# Patient Record
Sex: Female | Born: 1975 | Hispanic: No | Marital: Married | Smoking: Never smoker
Health system: Southern US, Community
[De-identification: ages and names within clinical notes are randomized; demographics above are authoritative.]

## PROBLEM LIST (undated history)

## (undated) DIAGNOSIS — E079 Disorder of thyroid, unspecified: Secondary | ICD-10-CM

## (undated) HISTORY — PX: LAPAROSCOPIC GASTRIC SLEEVE RESECTION: SHX5895

## (undated) HISTORY — PX: THYROIDECTOMY: SHX17

## (undated) HISTORY — PX: TONSILLECTOMY: SUR1361

---

## 1998-12-30 ENCOUNTER — Other Ambulatory Visit: Admission: RE | Admit: 1998-12-30 | Discharge: 1998-12-30 | Payer: Self-pay | Admitting: *Deleted

## 1999-04-03 ENCOUNTER — Other Ambulatory Visit: Admission: RE | Admit: 1999-04-03 | Discharge: 1999-04-03 | Payer: Self-pay | Admitting: Obstetrics and Gynecology

## 1999-05-06 ENCOUNTER — Encounter (INDEPENDENT_AMBULATORY_CARE_PROVIDER_SITE_OTHER): Payer: Self-pay

## 1999-05-06 ENCOUNTER — Other Ambulatory Visit: Admission: RE | Admit: 1999-05-06 | Discharge: 1999-05-06 | Payer: Self-pay | Admitting: *Deleted

## 1999-06-10 ENCOUNTER — Encounter: Payer: Self-pay | Admitting: *Deleted

## 1999-06-10 ENCOUNTER — Inpatient Hospital Stay (HOSPITAL_COMMUNITY): Admission: AD | Admit: 1999-06-10 | Discharge: 1999-06-10 | Payer: Self-pay | Admitting: *Deleted

## 1999-07-14 ENCOUNTER — Inpatient Hospital Stay (HOSPITAL_COMMUNITY): Admission: AD | Admit: 1999-07-14 | Discharge: 1999-07-14 | Payer: Self-pay | Admitting: *Deleted

## 1999-07-15 ENCOUNTER — Inpatient Hospital Stay (HOSPITAL_COMMUNITY): Admission: AD | Admit: 1999-07-15 | Discharge: 1999-07-17 | Payer: Self-pay | Admitting: Obstetrics and Gynecology

## 1999-09-08 ENCOUNTER — Other Ambulatory Visit: Admission: RE | Admit: 1999-09-08 | Discharge: 1999-09-08 | Payer: Self-pay | Admitting: Obstetrics & Gynecology

## 2001-03-06 ENCOUNTER — Other Ambulatory Visit: Admission: RE | Admit: 2001-03-06 | Discharge: 2001-03-06 | Payer: Self-pay | Admitting: Obstetrics & Gynecology

## 2001-08-23 ENCOUNTER — Inpatient Hospital Stay (HOSPITAL_COMMUNITY): Admission: AD | Admit: 2001-08-23 | Discharge: 2001-08-23 | Payer: Self-pay | Admitting: Obstetrics and Gynecology

## 2001-09-03 ENCOUNTER — Encounter (INDEPENDENT_AMBULATORY_CARE_PROVIDER_SITE_OTHER): Payer: Self-pay | Admitting: Specialist

## 2001-09-03 ENCOUNTER — Inpatient Hospital Stay (HOSPITAL_COMMUNITY): Admission: AD | Admit: 2001-09-03 | Discharge: 2001-09-07 | Payer: Self-pay | Admitting: *Deleted

## 2001-09-12 ENCOUNTER — Inpatient Hospital Stay (HOSPITAL_COMMUNITY): Admission: AD | Admit: 2001-09-12 | Discharge: 2001-09-12 | Payer: Self-pay | Admitting: *Deleted

## 2002-10-11 ENCOUNTER — Other Ambulatory Visit: Admission: RE | Admit: 2002-10-11 | Discharge: 2002-10-11 | Payer: Self-pay | Admitting: Diagnostic Radiology

## 2002-10-23 ENCOUNTER — Encounter: Payer: Self-pay | Admitting: General Surgery

## 2002-10-24 ENCOUNTER — Ambulatory Visit (HOSPITAL_COMMUNITY): Admission: RE | Admit: 2002-10-24 | Discharge: 2002-10-24 | Payer: Self-pay | Admitting: General Surgery

## 2002-11-08 ENCOUNTER — Other Ambulatory Visit: Admission: RE | Admit: 2002-11-08 | Discharge: 2002-11-08 | Payer: Self-pay | Admitting: Obstetrics & Gynecology

## 2003-03-06 ENCOUNTER — Inpatient Hospital Stay (HOSPITAL_COMMUNITY): Admission: AD | Admit: 2003-03-06 | Discharge: 2003-03-06 | Payer: Self-pay | Admitting: Obstetrics and Gynecology

## 2003-04-26 ENCOUNTER — Inpatient Hospital Stay (HOSPITAL_COMMUNITY): Admission: AD | Admit: 2003-04-26 | Discharge: 2003-04-28 | Payer: Self-pay | Admitting: Obstetrics & Gynecology

## 2004-01-30 ENCOUNTER — Ambulatory Visit: Payer: Self-pay | Admitting: Internal Medicine

## 2004-02-07 ENCOUNTER — Ambulatory Visit: Payer: Self-pay | Admitting: Internal Medicine

## 2004-02-12 ENCOUNTER — Encounter (HOSPITAL_COMMUNITY): Admission: RE | Admit: 2004-02-12 | Discharge: 2004-05-12 | Payer: Self-pay | Admitting: Internal Medicine

## 2004-02-29 ENCOUNTER — Emergency Department (HOSPITAL_COMMUNITY): Admission: EM | Admit: 2004-02-29 | Discharge: 2004-02-29 | Payer: Self-pay | Admitting: Family Medicine

## 2004-03-25 ENCOUNTER — Ambulatory Visit (HOSPITAL_COMMUNITY): Admission: RE | Admit: 2004-03-25 | Discharge: 2004-03-25 | Payer: Self-pay | Admitting: Internal Medicine

## 2004-03-25 ENCOUNTER — Ambulatory Visit: Payer: Self-pay | Admitting: Internal Medicine

## 2004-03-26 ENCOUNTER — Observation Stay (HOSPITAL_COMMUNITY): Admission: RE | Admit: 2004-03-26 | Discharge: 2004-03-27 | Payer: Self-pay | Admitting: General Surgery

## 2004-03-26 ENCOUNTER — Encounter (INDEPENDENT_AMBULATORY_CARE_PROVIDER_SITE_OTHER): Payer: Self-pay | Admitting: *Deleted

## 2004-04-29 ENCOUNTER — Ambulatory Visit: Payer: Self-pay | Admitting: Internal Medicine

## 2006-04-09 IMAGING — CR DG CHEST 2V
2 series · 2 of 2 positions shown · non-contrast
Comparison: Chest x-ray 10/23/02 reviewed.

CLINICAL DATA: Preoperative respiratory films.
 [id number] VIEW:

[view not recorded (1 of 2)]
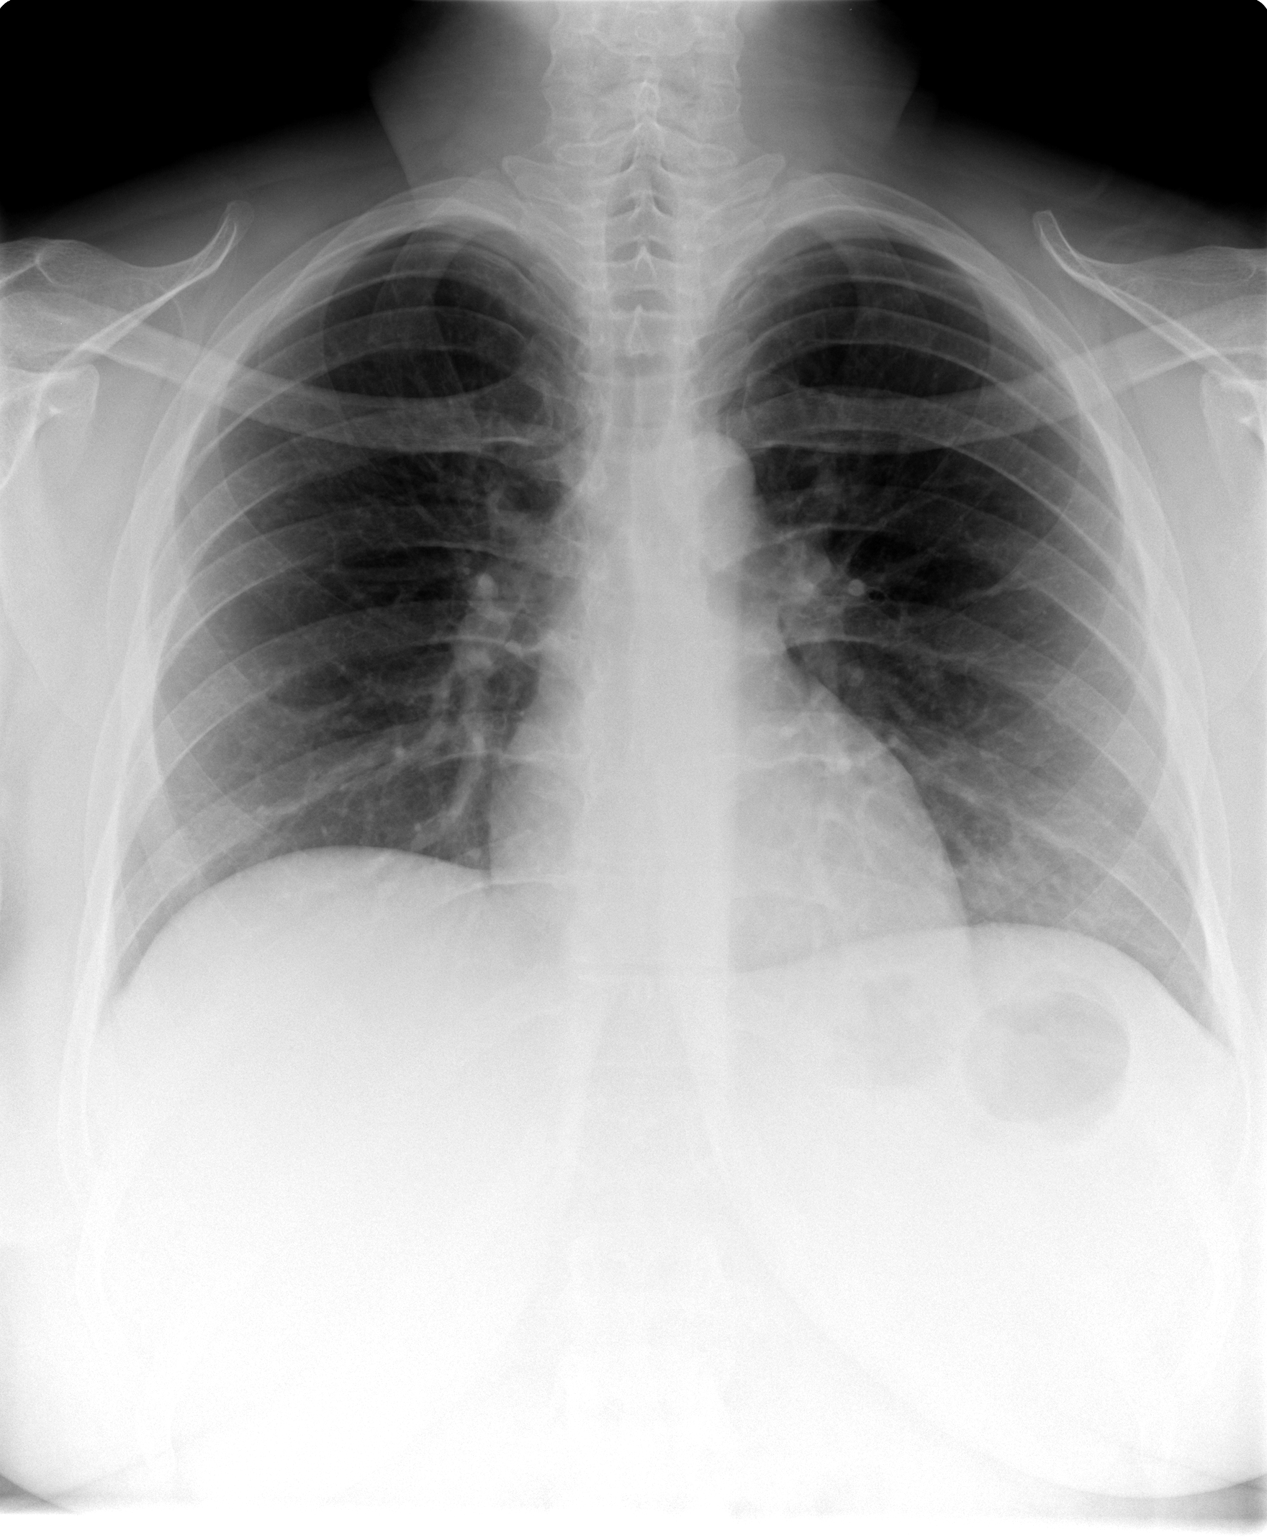

[view not recorded (2 of 2)]
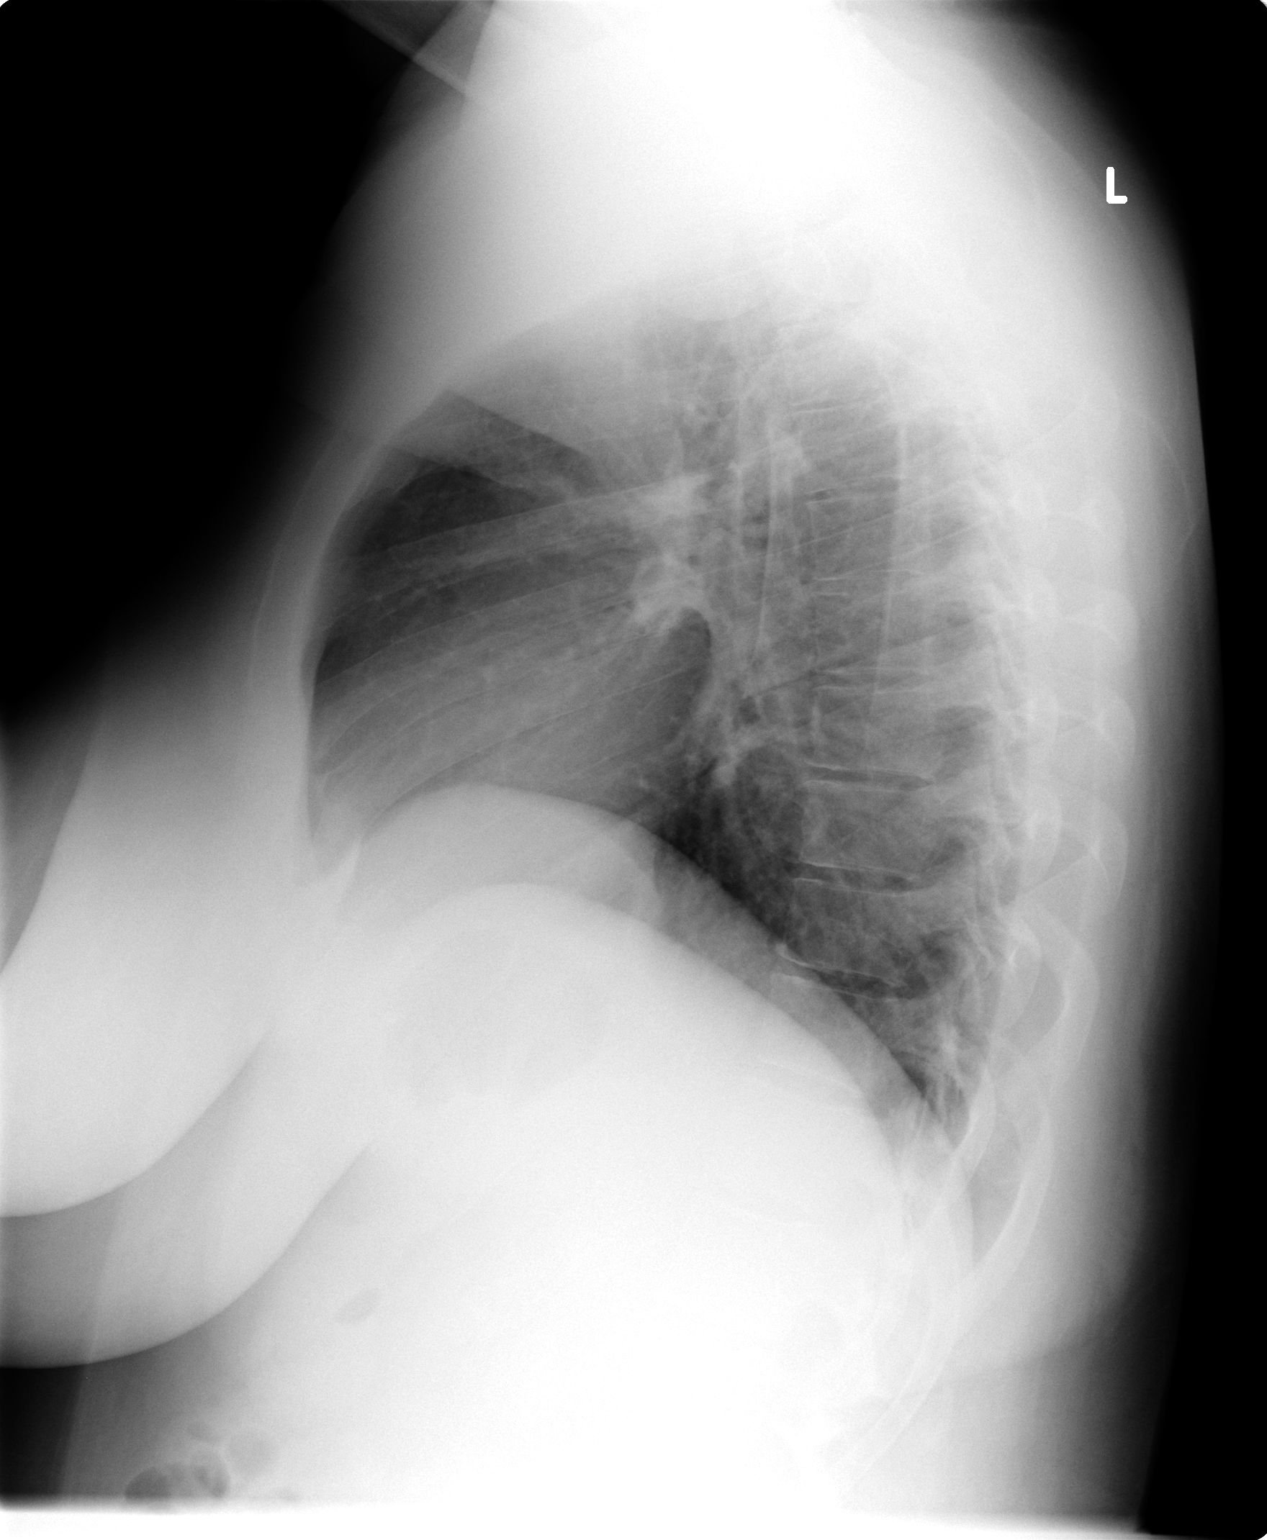

[2 of 2 positions shown; findings below may reference images not displayed]

FINDINGS: Lung volumes are low but the lungs are clear.  Costophrenic angles are sharp.  Heart size normal.  No focal bony abnormality.
IMPRESSION: Negative study.

## 2007-06-13 ENCOUNTER — Encounter: Admission: RE | Admit: 2007-06-13 | Discharge: 2007-06-13 | Payer: Self-pay | Admitting: Family Medicine

## 2008-02-06 ENCOUNTER — Encounter: Admission: RE | Admit: 2008-02-06 | Discharge: 2008-02-06 | Payer: Self-pay | Admitting: Family Medicine

## 2009-07-04 ENCOUNTER — Encounter: Admission: RE | Admit: 2009-07-04 | Discharge: 2009-07-04 | Payer: Self-pay | Admitting: Infectious Diseases

## 2010-01-27 ENCOUNTER — Other Ambulatory Visit: Payer: Self-pay | Admitting: Internal Medicine

## 2010-01-27 ENCOUNTER — Other Ambulatory Visit: Payer: Self-pay | Admitting: *Deleted

## 2010-01-27 DIAGNOSIS — N63 Unspecified lump in unspecified breast: Secondary | ICD-10-CM

## 2010-05-22 NOTE — Discharge Summary (Signed)
NAME:  Penny Walker, Penny Walker                         ACCOUNT NO.:  [id number]   MEDICAL RECORD NO.:  [id number]                   PATIENT TYPE:  INP   LOCATION:  9111                                 FACILITY:  [facility]   PHYSICIAN:  Phil D. Okey Dupre, M.D.                  DATE OF BIRTH:  [birth year]   DATE OF ADMISSION:  09/03/2001  DATE OF DISCHARGE:  09/07/2001                                 DISCHARGE SUMMARY   ADMITTING DIAGNOSIS:  Active labor.   DISCHARGE DIAGNOSIS:  Cesarean delivery of a single liveborn.   BRIEF ADMISSION HISTORY AND PHYSICAL:  The patient is a 35 year old G2, P1-0-  0-1 who presented at 40-1/7 weeks complaining of contractions.  She received  prenatal care at [facility] by the [facility] with onset of  care at 13 weeks.  Her previous pregnancy was in 2001 and was a spontaneous  vaginal delivery at term.  On exam her cervix was found to be 4 cm dilated,  70% effaced with the fetus at -1 station and vertex.  Fetal heart tracings  were reassuring and she was contracting every 6-10 minutes.  Membranes were  intact.  She was admitted to labor and delivery in early active labor.   HOSPITAL COURSE:  The patient was managed expectantly.  After her cervix  remained unchanged for several hours she was started on low-dose Pitocin to  reinstitute her labor pattern.  Stadol was given intravenously for pain  control.  Fetal heart tracings showed some decelerations from a baseline  heart rate of 120 to 130 down to 80s to 90s for approximately 3 minutes.  A  fetal scalp electrode and IUPC were placed.  Amnioinfusion was started.  After the patient received an epidural her blood pressure fell and she  received ephedrine x3 with a resultant increase in blood pressure.  However,  a prolonged decel lasting approximately 8 minutes with a fetal heart rate  less than 80 occurred.  After this decel the heart rate returned to  baseline.  Vaginal exam showed the cervix to be 7 cm  dilated, 100% effaced  and a 0 station.  IV fluid bolus was given.  However, the fetal heart rate  continued to experience deep variables eventually with slow return to  baseline and decreased variability.  The patient was then taken to C-section  for failure to progress and fetal intolerance of labor.  A low transverse  cesarean section was performed and a viable female infant was delivered using  a forceps blade.  It was found that the baby had a nuchal cord x1.  The baby  weighed 7 pounds 5 ounces and had Apgars of 7 at one minute, 9 at five  minutes and cord a pH of 7.3.  Both mother and baby tolerated the procedure  well.  A JP drain was placed during the procedure and removed on postop day  #  2.  The patient recovered appropriately and was discharged home on postop  day #3.  At discharge she expressed interest in having an IUD placed for  birth control at 6 weeks however, she did not desire any birth control upon  discharge.   DISCHARGE CONDITION:  Stable, good.   DISPOSITION:  Discharged to home with infant.   DISCHARGE MEDICATIONS:  1. Percocet 5/325 mg one pill q.4h. p.r.n. pain.  2. Motrin 600 mg one p.o. q.6h. p.r.n. cramps.  3. Colace 100 mg one p.o. q.a.m. and q.h.s. p.r.n. constipation.  4. Prenatal vitamins one p.o. q.d.   DISCHARGE INSTRUCTIONS:  1. Wound care per booklet.  2. Diet: Regular.  3. Activity: No heavy lifting (greater than 10 pounds) for 4 weeks and no     driving for 4 weeks.  Nothing per vagina for 6 weeks.  4. Followup: Return to maternity admissions at [facility] on Tuesday,     September 12, 2001 for staple removal.  Return to [facility] in 6 weeks     for routine postpartum followup.     Georgina Peer, M.D.                 Phil D. Okey Dupre, M.D.    JM/MEDQ  D:  10/21/2001  T:  10/22/2001  Job:  [id number]

## 2010-05-22 NOTE — H&P (Signed)
NAME:  Penny Walker, Penny Walker                         ACCOUNT NO.:  [id number]   MEDICAL RECORD NO.:  [id number]                   PATIENT TYPE:  INP   LOCATION:  9172                                 FACILITY:  [facility]   PHYSICIAN:  Genia Del, M.D.             DATE OF BIRTH:  03-Sep-1975   DATE OF ADMISSION:  04/26/2003  DATE OF DISCHARGE:                                HISTORY & PHYSICAL   PATIENT IDENTIFICATION:  Penny Walker is a 35 year old G3 P2 at 107 weeks 2  days gestation for an expected date of delivery May 01, 2003.   REASON FOR ADMISSION:  Increased uterine contractions with spontaneous  labor.   HISTORY OF PRESENT ILLNESS:  Increased uterine contractions every 5 minutes  with cervical change, no vaginal bleeding, no fluid leak, good fetal  movements, no PIH symptoms.   PAST MEDICAL HISTORY:  Hypothyroidism.   PAST SURGICAL HISTORY:  Tonsillectomy at 6 and C-section   PAST OBSTETRICAL HISTORY:  G1 in July 2001, 39+ weeks, vacuum-assisted  vaginal delivery, baby girl, 7 pounds 8 ounces.  G2 in September 2003, 40  weeks, urgent C-section low transverse for nonreassuring fetal heart rate,  baby boy, 7 pounds 11 ounces.  Postpartum depression in September 2003.   PAST GYNECOLOGICAL HISTORY:  VAIN-1.   ALLERGIES:  No known drug allergies.   MEDICATIONS:  Prenate vitamins and Synthroid 0.125 mg daily.   SOCIAL HISTORY:  Married, nonsmoker.   HISTORY OF PRESENT PREGNANCY:  In the first trimester a thyroid nodule was  found.  A fine needle aspirate showed benign cells and the decision was  taken to follow up postpartum.  Labs in the first trimester:  Hemoglobin  11.2, B positive, antibodies negative.  RPR, HBsAg, HIV nonreactive.  Rubella titer is immune.  Platelets 247.  In the second trimester, triple  test was within normal limits.  Ultrasound review of anatomy was within  normal limits at 20+ weeks.  The placenta was posterior, normal.  In the  third trimester, 1-hour  GTT was normal.  Follow-up with endocrinology  revealed a decrease in the size of the thyroid nodule and TSH and free T4  were normal.  An ultrasound at 32+ weeks revealed an estimated fetal weight  of the 52nd percentile, amniotic fluid was on the high side at the 90th  percentile.  Group B strep at 35+ weeks was negative.  Blood pressures  remained normal throughout pregnancy.   REVIEW OF SYSTEMS:  HEENT:  Negative.  CARDIOVASCULAR, RESPIRATORY:  Negative.  GI, URO:  Negative.  DERMATO, ENDOCRINO, NEURO:  Negative.   PHYSICAL EXAMINATION:  VITAL SIGNS:  Blood pressure 126/85, pulse 90 and  regular, respiratory rate 20, temperature 98 degrees.  LUNGS:  Clear bilaterally.  HEART:  Regular cardiac rhythm.  ABDOMEN:  Gravid uterus, cephalic presentation.  PELVIC:  Vaginal exam 4 cm, 80%, vertex, -2, fixed.  Membranes intact on  admission.  EXTREMITIES:  Lower limbs normal with mild edema.  MONITORING:  Fetal heart rate 140s with good accelerations and variability,  no decelerations.  Uterine contractions every 5 minutes lasting 60-70  seconds.   IMPRESSION:  Gravida 3 para 2 at 39 weeks 2 days gestation with spontaneous  labor with vaginal birth after cesarean attempt, candidate for it.  Group B  streptococcus negative.  Fetal heart rate monitoring reassuring.   PLAN:  1. Admit to labor and delivery.  2. Expectant management towards probable vaginal delivery.  3. Monitoring.                                               Genia Del, M.D.    ML/MEDQ  D:  04/26/2003  T:  04/26/2003  Job:  [id number]

## 2010-05-22 NOTE — Op Note (Signed)
NAME:  Penny Walker, Penny Walker                         ACCOUNT NO.:  [id number]   MEDICAL RECORD NO.:  [id number]                   PATIENT TYPE:  INP   LOCATION:  9111                                 FACILITY:  [facility]   PHYSICIAN:  Phil D. Okey Dupre, M.D.                  DATE OF BIRTH:  [birth year]   DATE OF PROCEDURE:  09/04/2001  DATE OF DISCHARGE:                                 OPERATIVE REPORT   PREOPERATIVE DIAGNOSIS:  Nonreassuring fetal heart pattern with failure to  progress and unable to tolerate labor.   POSTOPERATIVE DIAGNOSES:  1. Nonreassuring fetal heart pattern with failure to progress and unable to     tolerate labor.  2. Transverse arrest and nuchal cord.   PROCEDURE:  Low flap transverse cesarean section.   SURGEON:  Javier Glazier. Rose, M.D.   DESCRIPTION OF PROCEDURE:  Under satisfactory epidural  anesthesia with the  patient in the dorsal supine position, Foley catheter in the urinary  bladder, the abdomen was prepped and draped in the usual sterile manner and  entered through a Pfannenstiel incision situated 3 cm above the symphysis  pubis and extended for a total length of 16 cm.  The abdomen was entered by  layers and on entry into the abdominal cavity, the visceral peritoneum and  anterior surface of the uterus was opened transversely by sharp dissection.  Bladder was pushed away from the lower uterine segment and was entered by  sharp and blunt dissection and from a LOT  presentation using the Simpson  forceps blade, the baby was easily delivered and had a nuchal cord around  the neck, and it was a female with a weight of 7 pounds 5 ounces, Apgars of 7  and 9 and a cord pH of 7.3.  The placenta was spontaneously removed and the  uterus explored.  The uterus was closed with continuous running locked 0  Vicryl on an atraumatic needle.  Areas were observed for bleeding; none was  noted.  The fascia was closed from either incision meeting in the midpoint  with a continuous  running alternating locked 0 Vicryl on an atraumatic  needle.  Subcutaneous bleeders were controlled with hot cautery.  Because of  the thick fat layer, a Jackson-Pratt drain was placed in the wound and  exited to the left side through a small stab wound.  The skin edges were  approximated with skin staples.  The area where the drain exited was closed  with 3-0 silk suture which was used to secure the Jackson-Pratt drain.  The  patient had a total blood loss of 500 cc.  Tolerated the procedure well and  was transferred to the recovery room in satisfactory condition.  Phil D. Okey Dupre, M.D.    PDR/MEDQ  D:  09/04/2001  T:  09/04/2001  Job:  [id number]

## 2010-05-22 NOTE — Op Note (Signed)
NAMEDOROTHEA, YOW NO.:  [id number]   MEDICAL RECORD NO.:  [id number]          PATIENT TYPE:  OBV   LOCATION:  0098                         [facility]   PHYSICIAN:  Anselm Pancoast. Weatherly, M.D.DATE OF BIRTH:  [birth year]   DATE OF PROCEDURE:  03/26/2004  DATE OF DISCHARGE:                                 OPERATIVE REPORT   PREOPERATIVE DIAGNOSIS:  Nodule thyroid gland, left lobe.   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   OPERATION:  Left total lobectomy with removal of isthmus.   ANESTHESIA:  General.   SURGEON:  Anselm Pancoast. Zachery Dakins, M.D.   ASSISTANT:  Leonie Man, M.D.   HISTORY:  Penny Walker is a 35 year old female who I first saw in February,  2004 with a left thyroid nodule that was detected in the family practice  clinic.  At that time, she was being seen by her gynecologist, the left lobe  of the thyroid showed a definite nodule that ultrasound and aspiration  cytology showed some follicular cells but no evidence of any atypia.  She  became pregnant with her third child, and we have waited until the  completion of her pregnancy, which went successfully.  Returned.  I saw her  in the office in March.  The mass appears to be enlarging now, about 2 to  2.5 cm.  Not significantly bigger than it was before but definitely not  regressing.  Her TSH level was suppressed with thyroid replacement.  I  recommended we go ahead and do a left thyroid lobectomy.  She understands  that if we find a malignancy that we will need to do a total thyroidectomy  and understands about the recurrent laryngeal nerve, parathyroid, etc.  She  weighs about 255 pounds.  Is 5 feet 8 inches tall.  On examination, no other  definite abnormalities.   The patient preoperatively was given 1 gm of Kefzol and positioned on the OR  table.  After she had an endotracheal tube, positioned with the Gelfoam pad  under the scapula and a slight  reverse Trendelenburg with her neck  hyperextended.  In this position, the lump sits up pretty easily if she is  sitting flat.  Because of her size, it is much more difficult to feel.  Her  neck was then prepped with Betadine scrub and solution and draped in a  sterile manner.  First, I marked the manubrium midline and measured about  2.5 cm to the left and right and then using a silk tie, indented the area  over the gland.  This is not a natural crease but is parallel to the natural  creases.  Next, the incision was made.  Sharp dissection through the skin  and subcutaneous tissue and kind of elevating the platysma.  Then exposing  things well enough that a Mahorner retractor could be placed.  The incision  is exactly the size of the Mahorner, and I was working from the right side  with Dr. Lurene Shadow, the assistant, on the left.  Next, we elevated the strap  muscles midline  and separated from those of the left lobe of the thyroid.  The mass itself is located in the more medial portion of the left lobe.  The  strap muscles were elevated.  The little venous, that I think is the middle  vein, is not very large, and this was tied with a 4-0 Vicryl and both sides  then divided.  Then trying to get the mobility really with the mass being in  the midline, it really could not reflect the left lobe medially, and after  sort of freeing up the isthmus and the left lobe superior and inferior, the  little vessels were tied with 4-0 Vicryl or sutured with 4-0 Vicryl.  Then,  I elected to go ahead and divide the isthmus, basically where it joins the  right lobe, since it was very small and the nodule is close but definitely  separate from this area.  This on the stay side was sutured with a 4-0  Vicryl and the tie was placed on the go side for identification.  Next, the  inferior area was dissected.  The inferior thyroid artery was visualized as  was the inferior parathyroid gland.  We then directed our  attention to the  superior pole and with pretty strong retraction, I was able to encompass the  vessels right at the top part of the superior lobe, and these were tied in  two pedicles, both doubly tied with 2-0 silk.  The go side was also tied,  and then with freeing this, then we could now bring the gland over the  midline.  Next, we freed up the vessels from the inferior parathyroid.  The  little tributaries being clipped on the parathyroid side and lightly  coagulated on the other.  Then the inferior parathyroid artery after the  blood supply to the inferior parathyroid had been freed up, I then tied this  with a 2-0 silk also on the stay side.  With freeing this, and it definitely  gave [place] a whole lot more mobility, the superior parathyroid gland was  identified and likewise carefully dissected from the gland.  There were  little nodules of actual thyroid in this area, and we still had not  visualized the recurrent laryngeal nerve, and we were very cautious on  dividing anything in this area.  Then finally, when we got the superior  parathyroid freed, I could identify the recurrent laryngeal nerve as it was  going into the lateral horn of the thyroid cartilage and then the  attachments of the left lobe were freed, even with cautery or Metzenbaum  scissors, and good hemostasis obtained with light cauterization.  The area  was then sent for frozen.  Dr. Almyra Free examined it and says it is definitely  an adenoma.  She does not see any findings that would make her think this is  a malignancy, and nothing further was done.  I had kind of palpated the  right lobe, and it looks unremarkable.  I then closed the incisions.  The  strap muscles were reapproximated with interrupted sutures of 4-0 Vicryl.  Ellamae Sia was closed with interrupted 4-0 Vicryl.  Then 4-0 Monocryl  subcuticular and then 1/2 inch Steri-Strips and Benzoin on the skin incision.  The patient tolerated the procedure nicely and was  sent to the  recovery room in a stable, postoperative condition.  I talked with the  family, and hopefully, she will be ready for discharge in the morning.  She  is  breathing and talking satisfactorily at the completion of surgery while  in the recovery room.      WJW/MEDQ  D:  03/26/2004  T:  03/26/2004  Job:  [id number]

## 2010-06-04 ENCOUNTER — Other Ambulatory Visit (HOSPITAL_COMMUNITY): Payer: Self-pay | Admitting: Internal Medicine

## 2010-06-04 DIAGNOSIS — M545 Low back pain: Secondary | ICD-10-CM

## 2010-06-09 ENCOUNTER — Ambulatory Visit
Admission: RE | Admit: 2010-06-09 | Discharge: 2010-06-09 | Disposition: A | Discharge status: Home / Self Care | Payer: Self-pay | Source: Ambulatory Visit | Attending: *Deleted | Admitting: *Deleted

## 2010-06-09 ENCOUNTER — Ambulatory Visit
Admission: RE | Admit: 2010-06-09 | Discharge: 2010-06-09 | Disposition: A | Discharge status: Home / Self Care | Payer: Self-pay | Source: Ambulatory Visit | Attending: Internal Medicine | Admitting: Internal Medicine

## 2010-06-09 ENCOUNTER — Other Ambulatory Visit: Payer: Self-pay | Admitting: Internal Medicine

## 2010-06-09 ENCOUNTER — Ambulatory Visit (HOSPITAL_COMMUNITY)
Admission: RE | Admit: 2010-06-09 | Discharge: 2010-06-09 | Disposition: A | Discharge status: Home / Self Care | Payer: Self-pay | Source: Ambulatory Visit | Attending: Internal Medicine | Admitting: Internal Medicine

## 2010-06-09 ENCOUNTER — Other Ambulatory Visit (HOSPITAL_COMMUNITY): Payer: Self-pay

## 2010-06-09 DIAGNOSIS — N63 Unspecified lump in unspecified breast: Secondary | ICD-10-CM

## 2010-06-09 DIAGNOSIS — M545 Low back pain, unspecified: Secondary | ICD-10-CM | POA: Insufficient documentation

## 2010-06-09 DIAGNOSIS — M79609 Pain in unspecified limb: Secondary | ICD-10-CM | POA: Insufficient documentation

## 2010-06-09 DIAGNOSIS — M5126 Other intervertebral disc displacement, lumbar region: Secondary | ICD-10-CM | POA: Insufficient documentation

## 2010-11-12 ENCOUNTER — Encounter: Payer: Self-pay | Admitting: Emergency Medicine

## 2010-11-12 ENCOUNTER — Emergency Department (HOSPITAL_COMMUNITY)
Admission: EM | Admit: 2010-11-12 | Discharge: 2010-11-13 | Disposition: A | Discharge status: Home / Self Care | Payer: Self-pay | Attending: Emergency Medicine | Admitting: Emergency Medicine

## 2010-11-12 DIAGNOSIS — M545 Low back pain, unspecified: Secondary | ICD-10-CM | POA: Insufficient documentation

## 2010-11-12 DIAGNOSIS — S39012A Strain of muscle, fascia and tendon of lower back, initial encounter: Secondary | ICD-10-CM

## 2010-11-12 DIAGNOSIS — S335XXA Sprain of ligaments of lumbar spine, initial encounter: Secondary | ICD-10-CM | POA: Insufficient documentation

## 2010-11-12 DIAGNOSIS — X500XXA Overexertion from strenuous movement or load, initial encounter: Secondary | ICD-10-CM | POA: Insufficient documentation

## 2010-11-12 DIAGNOSIS — R209 Unspecified disturbances of skin sensation: Secondary | ICD-10-CM | POA: Insufficient documentation

## 2010-11-12 NOTE — ED Notes (Signed)
Family at bedside. 

## 2010-11-12 NOTE — ED Notes (Signed)
The pt has had pain today after she bent over to pick up an object.  She has had this in the past.  The last episode was 5 months ago.

## 2010-11-12 NOTE — ED Notes (Signed)
PT. REPORTS LOW BACK PAIN ONSET THIS EVENING , DENIES INJURY OR FALL , STATES TRYING TO PICK SOMETHING ON GROUND-BENDED .

## 2010-11-13 ENCOUNTER — Emergency Department (HOSPITAL_COMMUNITY): Payer: Self-pay

## 2010-11-13 LAB — URINE MICROSCOPIC-ADD ON

## 2010-11-13 LAB — URINALYSIS, ROUTINE W REFLEX MICROSCOPIC
Bilirubin Urine: NEGATIVE
Glucose, UA: NEGATIVE mg/dL
Hgb urine dipstick: NEGATIVE
Ketones, ur: NEGATIVE mg/dL
Nitrite: NEGATIVE
Protein, ur: NEGATIVE mg/dL
Specific Gravity, Urine: 1.025 (ref 1.005–1.030)
Urobilinogen, UA: 0.2 mg/dL (ref 0.0–1.0)
pH: 7 (ref 5.0–8.0)

## 2010-11-13 LAB — POCT PREGNANCY, URINE: Preg Test, Ur: NEGATIVE

## 2010-11-13 MED ORDER — ONDANSETRON 4 MG PO TBDP
8.0000 mg | ORAL_TABLET | Freq: Once | ORAL | Status: AC
  Administered 2010-11-13: 8 mg via ORAL
  Filled 2010-11-13: qty 2

## 2010-11-13 MED ORDER — PREDNISONE 20 MG PO TABS: 40.0000 mg | ORAL_TABLET | Freq: Every day | ORAL | Status: AC

## 2010-11-13 MED ORDER — HYDROCODONE-ACETAMINOPHEN 5-500 MG PO TABS: 1.0000 | ORAL_TABLET | Freq: Four times a day (QID) | ORAL | Status: AC | PRN

## 2010-11-13 MED ORDER — PREDNISONE 20 MG PO TABS
60.0000 mg | ORAL_TABLET | Freq: Once | ORAL | Status: AC
  Administered 2010-11-13: 60 mg via ORAL
  Filled 2010-11-13: qty 3

## 2010-11-13 MED ORDER — CYCLOBENZAPRINE HCL 10 MG PO TABS: 10.0000 mg | ORAL_TABLET | Freq: Three times a day (TID) | ORAL | Status: AC | PRN

## 2010-11-13 MED ORDER — HYDROMORPHONE HCL PF 2 MG/ML IJ SOLN
2.0000 mg | Freq: Once | INTRAMUSCULAR | Status: AC
  Administered 2010-11-13: 2 mg via INTRAMUSCULAR
  Filled 2010-11-13: qty 1

## 2010-11-13 NOTE — ED Notes (Signed)
Pt sleeping until arroused.  Po fluids given.  Family back at the bedside

## 2010-11-13 NOTE — ED Notes (Signed)
Pt c/o the wait. explantion given for the wait.  Pt upset

## 2010-11-13 NOTE — ED Provider Notes (Signed)
History     CSN: [id number] Arrival date & time: 11/12/2010  9:15 PM   First MD Initiated Contact with Patient 11/13/10 0017      Chief Complaint  Patient presents with  . Back Pain     Patient is a 35 y.o. female presenting with back pain. The history is provided by the patient. No language interpreter was used.  Back Pain  This is a recurrent problem. The current episode started 6 to 12 hours ago. The problem occurs rarely. The problem has been gradually worsening. The pain is associated with a recent injury. The pain is present in the lumbar spine. The quality of the pain is described as stabbing. The pain does not radiate. The pain is at a severity of 10/10. The pain is severe. The symptoms are aggravated by bending and certain positions. The pain is the same all the time. Associated symptoms include numbness. Pertinent negatives include no fever, no bladder incontinence, no dysuria and no paresthesias. Risk factors include obesity.  States did some unusually strenuous housework today when bent over to pick up something, felt a sharp pain and was nearly unable to stand straight. Pain has worsened through-out the evening. Hx similar sx's in the past, MRI revealed "bulging disc".   Past Surgical History  Procedure Date  . Thyroidectomy   . Tonsillectomy     No family history on file.  History  Substance Use Topics  . Smoking status: Never Smoker   . Smokeless tobacco: Not on file  . Alcohol Use: No    OB History    Grav Para Term Preterm Abortions TAB SAB Ect Mult Living                  Review of Systems  Constitutional: Negative.  Negative for fever.  HENT: Negative.   Eyes: Negative.   Respiratory: Negative.   Cardiovascular: Negative.   Gastrointestinal: Negative.        No fecal incontinence  Genitourinary: Negative.  Negative for bladder incontinence, dysuria and difficulty urinating.       No urinary retention, incontinence or difficulty  Musculoskeletal:  Positive for back pain.  Skin: Negative.   Neurological: Positive for numbness. Negative for paresthesias.       Numbness to bil toes No perineal numbness  Hematological: Negative.   Psychiatric/Behavioral: Negative.     Allergies  Review of patient's allergies indicates no known allergies.  Home Medications   Current Outpatient Rx  Name Route Sig Dispense Refill  . CYCLOBENZAPRINE HCL 5 MG PO TABS Oral Take 5 mg by mouth at bedtime.      Marland Kitchen LEVOTHYROXINE SODIUM 25 MCG PO TABS Oral Take 25 mcg by mouth daily.      Marland Kitchen METHOCARBAMOL 500 MG PO TABS Oral Take 500 mg by mouth every 8 (eight) hours as needed. For muscle spasms     . CYCLOBENZAPRINE HCL 10 MG PO TABS Oral Take 1 tablet (10 mg total) by mouth 3 (three) times daily as needed for muscle spasms. 30 tablet 0  . HYDROCODONE-ACETAMINOPHEN 5-500 MG PO TABS Oral Take 1-2 tablets by mouth every 6 (six) hours as needed for pain. 15 tablet 0  . PREDNISONE 20 MG PO TABS Oral Take 2 tablets (40 mg total) by mouth daily. 10 tablet 0    BP 112/77  Pulse 82  Temp(Src) 97.2 F (36.2 C) (Oral)  Resp 19  SpO2 99%  Physical Exam  Constitutional: She is oriented to person, place, and  time. She appears well-developed and well-nourished.  Cardiovascular: Normal rate.   Musculoskeletal:       Lumbar back: She exhibits tenderness and bony tenderness.  Neurological: She is alert and oriented to person, place, and time. She has normal strength. No sensory deficit.       BLE DTR's 1+  Skin: Skin is warm and dry.  Psychiatric: She has a normal mood and affect.    ED Course  Procedures Pain improved after medications. No focal neurological deficits. Tolerated unassisted ambulation in ED prior to d/c.  Labs Reviewed  URINALYSIS, ROUTINE W REFLEX MICROSCOPIC - Abnormal; Notable for the following:    Leukocytes, UA SMALL (*)    All other components within normal limits  URINE MICROSCOPIC-ADD ON - Abnormal; Notable for the following:     Bacteria, UA FEW (*)    All other components within normal limits  POCT PREGNANCY, URINE  LAB REPORT - SCANNED   Dg Lumbar Spine Complete  11/13/2010  *RADIOLOGY REPORT*  Clinical Data: Low back injury and pain  LUMBAR SPINE - COMPLETE 4+ VIEW  Comparison: 06/09/2010  Findings: Anatomic alignment.  No vertebral body height loss.  Mild narrowing of the L4-5 disc. IUD projects over the midline of the pelvis.  IMPRESSION: No acute bony pathology.  Original Report Authenticated By: Donavan Burnet, M.D.     1. Low back pain   2. Lumbar strain       MDM  Result from MRI of 06/2010 MRI LUMBAR SPINE WITHOUT CONTRAST  Technique: Multiplanar and multiecho pulse sequences of the lumbar  spine were obtained without intravenous contrast.  Comparison: Lumbar spine series 03/25/2004.  Findings: The sagittal MR images demonstrate normal alignment of  the lumbar vertebral bodies. They demonstrate normal marrow  signal. The last full intervertebral disc space is labeled L5-S1.  The conus medullaris terminates at L1.  The facets are normally aligned. No pars defects. No paraspinal  or retroperitoneal process is identified.  No significant findings at T12-L1, the L1-L2, L2-L3 or L5-S1.  L3-4: There is a shallow disc protrusion slightly asymmetric to  the right with minimal impression on the thecal sac. No foraminal  stenosis.  L4-5: Annular rent and tiny central disc protrusion but no  significant neural compression. No foraminal stenosis.  IMPRESSION:  Very shallow disc protrusions at L3-4 and L4-5.  Pt currently w/o neurological deficits. L-S spine XR shows continued mild narrowing at L4-5, otherwise w/o acute findings.Pain improved w/ medication. Will plan for d/c home on prednisone, refill Flexeril and medication for pain and encourage close f/u w/ PCP if not improving over next several days. Pt agreeable w/ plan.   Leanne Chang, NP 11/13/10 [phone]

## 2010-11-13 NOTE — ED Notes (Signed)
Pt given pain med and to xray she is sleeping at present

## 2010-11-16 NOTE — ED Provider Notes (Signed)
Medical screening examination/treatment/procedure(s) were performed by non-physician practitioner and as supervising physician I was immediately available for consultation/collaboration.   Dayton Bailiff, MD 11/16/10 2009

## 2011-05-27 ENCOUNTER — Encounter (HOSPITAL_COMMUNITY): Payer: Self-pay | Admitting: Emergency Medicine

## 2011-05-27 ENCOUNTER — Emergency Department (INDEPENDENT_AMBULATORY_CARE_PROVIDER_SITE_OTHER)
Admission: EM | Admit: 2011-05-27 | Discharge: 2011-05-27 | Disposition: A | Discharge status: Home / Self Care | Payer: Self-pay | Source: Home / Self Care | Attending: Emergency Medicine | Admitting: Emergency Medicine

## 2011-05-27 DIAGNOSIS — M2669 Other specified disorders of temporomandibular joint: Secondary | ICD-10-CM

## 2011-05-27 DIAGNOSIS — R32 Unspecified urinary incontinence: Secondary | ICD-10-CM

## 2011-05-27 DIAGNOSIS — M26629 Arthralgia of temporomandibular joint, unspecified side: Secondary | ICD-10-CM

## 2011-05-27 HISTORY — DX: Disorder of thyroid, unspecified: E07.9

## 2011-05-27 LAB — POCT URINALYSIS DIP (DEVICE)
Leukocytes, UA: NEGATIVE
Nitrite: NEGATIVE
Protein, ur: NEGATIVE mg/dL
Urobilinogen, UA: 0.2 mg/dL (ref 0.0–1.0)
pH: 5 (ref 5.0–8.0)

## 2011-05-27 MED ORDER — TRAMADOL HCL 50 MG PO TABS: 100.0000 mg | ORAL_TABLET | Freq: Three times a day (TID) | ORAL | Status: AC | PRN

## 2011-05-27 MED ORDER — MELOXICAM 7.5 MG PO TABS: 7.5000 mg | ORAL_TABLET | Freq: Every day | ORAL | Status: DC

## 2011-05-27 MED ORDER — OXYBUTYNIN CHLORIDE 5 MG PO TABS: 5.0000 mg | ORAL_TABLET | Freq: Three times a day (TID) | ORAL | Status: DC

## 2011-05-27 NOTE — ED Provider Notes (Signed)
Chief Complaint  Patient presents with  . Otalgia  . Sore Throat    History of Present Illness:   The patient is a 36 year old female with a ten-day history of right ear pain. She denies any drainage, difficulty hearing, ringing in the ear, or vertigo. She's had a slight sore throat in the right and feels like she may have a low-grade fever. She denies nasal congestion, drainage, rhinorrhea, or cough. She has been on antibiotics for about 10 days. Both of [place]. The first antibiotic she did know the name of, but she's been on Augmentin for 5 days without any relief. She denies any history of ear infections or ear problems in the past.  Her second problem has been that of incontinence. She notes both stress and urgency incontinence. She denies any urinary frequency, dysuria, or hematuria. She does have some urgency. She denies any history of urinary tract infection. She does have 3 children.  Review of Systems:  Other than noted above, the patient denies any of the following symptoms: Systemic:  No fevers, chills, sweats, weight loss or gain, fatigue, or tiredness. Eye:  No redness, pain, discharge, itching, blurred vision, or diplopia. ENT:  No headache, nasal congestion, sneezing, itching, epistaxis, ear pain, congestion, decreased hearing, ringing in ears, vertigo, or tinnitus.  No oral lesions, sore throat, pain on swallowing, or hoarseness. Neck:  No mass, tenderness or adenopathy. Lungs:  No coughing, wheezing, or shortness of breath. Skin:  No rash or itching.  PMFSH:  Past medical history, family history, social history, meds, and allergies were reviewed.  Physical Exam:   Vital signs:  BP 111/75  Pulse 88  Temp(Src) 97.1 F (36.2 C) (Oral)  Resp 14  SpO2 96%  LMP 05/20/2011 General:  Alert and oriented.  In no distress.  Skin warm and dry. Eye:  PERRL, full EOMs, lids and conjunctiva normal.   ENT:  TMs and canals  clear.  Nasal mucosa not congested and without drainage.  Mucous membranes moist, no oral lesions, normal dentition, pharynx clear.  No cranial or facial pain to palplation. She has mild pain to palpation over the right TMJ. No swelling. The jaw had a full range of motion with no clicking or popping. Dentition appears normal. Throat is completely normal. There's no pain on movement of the external ear. Neck:  Supple, full ROM.  No adenopathy, tenderness or mass.  Thyroid normal. Lungs:  Breath sounds clear and equal bilaterally.  No wheezes, rales or rhonchi. Heart:  Rhythm regular, without extrasystoles.  No gallops or murmers. Skin:  Clear, warm and dry.  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 A diagnosis of Incontinence was also pertinent to this visit. 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 I have advised her to follow a soft diet, followup with a dentist, and given her the medications as listed below. I also advised to followup with a urologist for the incontinence and have given her a prescription for oxybutynin in the meantime.  Plan:   1.  The following meds were prescribed:   New Prescriptions   MELOXICAM (MOBIC) 7.5 MG TABLET    Take 1 tablet (7.5 mg total) by mouth daily.   OXYBUTYNIN (DITROPAN) 5 MG TABLET    Take 1 tablet (5 mg total) by mouth 3 (three) times  daily.   TRAMADOL (ULTRAM) 50 MG TABLET    Take 2 tablets (100 mg total) by mouth every 8 (eight) hours as needed for pain.   2.  The patient was instructed in symptomatic care and handouts were given. 3.  The patient was told to return if becoming worse in any way, if no better in 3 or 4 days, and given some red flag symptoms that would indicate earlier return.  Follow up:  The patient was told to follow up with a dentist of her choice as soon as possible and also with Dr. Brunilda Payor for the incontinence.       Reuben Likes, MD 05/27/11 1012

## 2011-05-27 NOTE — ED Notes (Signed)
PT HERE WITH C/O SORE THROAT X 4 DYS WHICH EASED UP TAKING SOME ATB'S [place].NOW HERE WITH RIGHT EAR PAIN.NO FEVERS NOTED BUT SOME NAUSEA.

## 2011-05-27 NOTE — Discharge Instructions (Signed)
Follow a soft diet.  See dentist as soon as possible.  Go to www.goodrx.com to look up your medications. This will give you a list of where you can find your prescriptions at the most affordable prices.   RESOURCE GUIDE  Dental Problems  Look up DebtSupply.pl.asp for a schedule of the [facility]'s free dental clinics called [address] [place]. They have clinics all around [place]. Get there early and be prepared to wait.   [facility] [address]  [place], [place] [address] [phone]  [facility] [address] [place], [place] [phone]  Patients with Medicaid: [facility] [phone]                                [phone] [address] Phone:  [phone]                                                  Phone:  [phone]  If unable to pay or uninsured, contact:  [facility] or [facility]. to become qualified for the adult dental clinic.

## 2011-09-10 ENCOUNTER — Emergency Department (INDEPENDENT_AMBULATORY_CARE_PROVIDER_SITE_OTHER): Payer: Self-pay

## 2011-09-10 ENCOUNTER — Emergency Department (INDEPENDENT_AMBULATORY_CARE_PROVIDER_SITE_OTHER)
Admission: EM | Admit: 2011-09-10 | Discharge: 2011-09-10 | Disposition: A | Discharge status: Home / Self Care | Payer: Self-pay | Source: Home / Self Care | Attending: Emergency Medicine | Admitting: Emergency Medicine

## 2011-09-10 ENCOUNTER — Encounter (HOSPITAL_COMMUNITY): Payer: Self-pay | Admitting: Emergency Medicine

## 2011-09-10 DIAGNOSIS — M659 Synovitis and tenosynovitis, unspecified: Secondary | ICD-10-CM

## 2011-09-10 DIAGNOSIS — M7750 Other enthesopathy of unspecified foot: Secondary | ICD-10-CM

## 2011-09-10 MED ORDER — DICLOFENAC SODIUM 75 MG PO TBEC: 75.0000 mg | DELAYED_RELEASE_TABLET | ORAL | Status: AC

## 2011-09-10 MED ORDER — DICLOFENAC SODIUM 75 MG PO TBEC: 75.0000 mg | DELAYED_RELEASE_TABLET | ORAL | Status: DC

## 2011-09-10 NOTE — ED Notes (Signed)
Pt c/o pain on her right foot x3 weeks... Does not recall any recent inj but did injured her foot last year when a couch fell on her foot... She did not seek any medical attention due to lack of insurance... She denies: fevers, vomiting, nausea, diarrhea.

## 2011-09-10 NOTE — ED Provider Notes (Signed)
History     CSN: [id number]  Arrival date & time 09/10/11  7829   First MD Initiated Contact with Patient 09/10/11 [phone]      Chief Complaint  Patient presents with  . Foot Pain    (Consider location/radiation/quality/duration/timing/severity/associated sxs/prior treatment) Patient is a 36 y.o. female presenting with lower extremity pain. The history is provided by the patient.  Foot Pain This is a chronic (One year, exacerbated the last 3 weeks) problem. The problem occurs constantly. The problem has not changed since onset.Pertinent negatives include no chest pain. The symptoms are aggravated by bending, standing and walking. Nothing relieves the symptoms. She has tried nothing for the symptoms. The treatment provided no relief.    Past Medical History  Diagnosis Date  . Thyroid disease     Past Surgical History  Procedure Date  . Thyroidectomy   . Tonsillectomy     No family history on file.  History  Substance Use Topics  . Smoking status: Never Smoker   . Smokeless tobacco: Not on file  . Alcohol Use: Yes     OCASSIONALLY    OB History    Grav Para Term Preterm Abortions TAB SAB Ect Mult Living                  Review of Systems  Constitutional: Negative for chills, activity change and appetite change.  Cardiovascular: Negative for chest pain.  Skin: Negative for rash and wound.  Neurological: Negative for weakness and numbness.    Allergies  Review of patient's allergies indicates no known allergies.  Home Medications   Current Outpatient Rx  Name Route Sig Dispense Refill  . LEVOTHYROXINE SODIUM 25 MCG PO TABS Oral Take 25 mcg by mouth daily.      . CYCLOBENZAPRINE HCL 5 MG PO TABS Oral Take 5 mg by mouth at bedtime.      . MELOXICAM 7.5 MG PO TABS Oral Take 1 tablet (7.5 mg total) by mouth daily. 15 tablet 0  . METHOCARBAMOL 500 MG PO TABS Oral Take 500 mg by mouth every 8 (eight) hours as needed. For muscle spasms     . OXYBUTYNIN CHLORIDE 5 MG  PO TABS Oral Take 1 tablet (5 mg total) by mouth 3 (three) times daily. 90 tablet 0    BP 124/69  Pulse 69  Temp 97.8 F (36.6 C) (Oral)  Resp 18  SpO2 100%  LMP 08/27/2011  Physical Exam  Nursing note and vitals reviewed. Constitutional: Vital signs are normal. She appears well-developed and well-nourished.  Non-toxic appearance. She does not have a sickly appearance. She does not appear ill.  HENT:  Head: Normocephalic.  Eyes: Conjunctivae are normal.  Neck: Neck supple.  Cardiovascular: Normal rate.   No murmur heard. Pulmonary/Chest: Effort normal.  Musculoskeletal: She exhibits tenderness.       Right ankle: She exhibits normal range of motion, no swelling, no ecchymosis and no deformity. tenderness. Head of 5th metatarsal tenderness found. No lateral malleolus, no medial malleolus, no AITFL and no CF ligament tenderness found. Achilles tendon normal.       Feet:  Neurological: She is alert.  Skin: No rash noted. No erythema.    ED Course  Procedures (including critical care time)  Labs Reviewed - No data to display No results found.   No diagnosis found.    MDM          Jimmie Molly, MD 09/10/11 1048

## 2011-09-10 NOTE — ED Notes (Signed)
Received fax from [facility] , asking for clarification on dosage schedule; As Dr Ladon Applebaum is not in office, Dr Amada Jupiter authorized to take 1 pill BID; spoke directly w pharmacist to relay message

## 2011-10-28 ENCOUNTER — Emergency Department (INDEPENDENT_AMBULATORY_CARE_PROVIDER_SITE_OTHER)
Admission: EM | Admit: 2011-10-28 | Discharge: 2011-10-28 | Disposition: A | Discharge status: Home / Self Care | Payer: Self-pay | Source: Home / Self Care | Attending: Emergency Medicine | Admitting: Emergency Medicine

## 2011-10-28 ENCOUNTER — Encounter (HOSPITAL_COMMUNITY): Payer: Self-pay | Admitting: Emergency Medicine

## 2011-10-28 DIAGNOSIS — K089 Disorder of teeth and supporting structures, unspecified: Secondary | ICD-10-CM

## 2011-10-28 DIAGNOSIS — K047 Periapical abscess without sinus: Secondary | ICD-10-CM

## 2011-10-28 DIAGNOSIS — K0889 Other specified disorders of teeth and supporting structures: Secondary | ICD-10-CM

## 2011-10-28 MED ORDER — TRAMADOL HCL 50 MG PO TABS: 50.0000 mg | ORAL_TABLET | Freq: Four times a day (QID) | ORAL | Status: DC | PRN

## 2011-10-28 MED ORDER — PENICILLIN V POTASSIUM 500 MG PO TABS: 500.0000 mg | ORAL_TABLET | Freq: Three times a day (TID) | ORAL | Status: DC

## 2011-10-28 NOTE — ED Notes (Signed)
Pt c/o upper right dental pain/wisdom teeth x 1wk.  Pt states that bleeding occurs with brushing.  Pt denies swelling or drainage of puss.

## 2011-10-28 NOTE — ED Notes (Signed)
Pt needs referral. Before discharge.

## 2011-10-28 NOTE — ED Provider Notes (Signed)
History     CSN: [id number]  Arrival date & time 10/28/11  1609   None     Chief Complaint  Patient presents with  . Dental Pain    (Consider location/radiation/quality/duration/timing/severity/associated sxs/prior treatment) Patient is a 36 y.o. female presenting with tooth pain. The history is provided by the patient.  Dental PainThe primary symptoms include mouth pain. Primary symptoms do not include dental injury. The symptoms began 5 to 7 days ago (hx of pain in same area, states improved but returned.). The symptoms are worsening. The symptoms are new. The symptoms occur constantly.  Additional symptoms include: dental sensitivity to temperature, gum swelling, gum tenderness and ear pain. Additional symptoms do not include: trismus, jaw pain, facial swelling, trouble swallowing, pain with swallowing, excessive salivation, taste disturbance, hearing loss, nosebleeds, swollen glands, goiter and fatigue. Medical issues do not include: alcohol problem, smoking, chewing tobacco, immunosuppression, periodontal disease and cancer.  right upper back tooth pain, worse with eating, has taken ibuprofen for pain with some relief.  Pain is worse at night.    Past Medical History  Diagnosis Date  . Thyroid disease     Past Surgical History  Procedure Date  . Thyroidectomy   . Tonsillectomy   . Cesarean section     History reviewed. No pertinent family history.  History  Substance Use Topics  . Smoking status: Never Smoker   . Smokeless tobacco: Not on file  . Alcohol Use: Yes     OCASSIONALLY    OB History    Grav Para Term Preterm Abortions TAB SAB Ect Mult Living                  Review of Systems  Constitutional: Negative.  Negative for fatigue.  HENT: Positive for ear pain and dental problem. Negative for hearing loss, nosebleeds, facial swelling and trouble swallowing.   Respiratory: Negative.   Cardiovascular: Negative.     Allergies  Review of patient's  allergies indicates no known allergies.  Home Medications   Current Outpatient Rx  Name Route Sig Dispense Refill  . LEVOTHYROXINE SODIUM 25 MCG PO TABS Oral Take 25 mcg by mouth daily.      [place] Kitchen METHOCARBAMOL 500 MG PO TABS Oral Take 500 mg by mouth every 8 (eight) hours as needed. For muscle spasms     . OXYBUTYNIN CHLORIDE 5 MG PO TABS Oral Take 1 tablet (5 mg total) by mouth 3 (three) times daily. 90 tablet 0    BP 121/71  Pulse 72  Temp 98.9 F (37.2 C) (Oral)  Resp 16  SpO2 100%  LMP 10/24/2011  Physical Exam  Nursing note and vitals reviewed. Constitutional: She is oriented to person, place, and time. Vital signs are normal. She appears well-developed and well-nourished. She is active and cooperative.  HENT:  Head: Normocephalic. No trismus in the jaw.  Right Ear: Hearing, tympanic membrane, external ear and ear canal normal.  Left Ear: Hearing, tympanic membrane and external ear normal.  Nose: Nose normal. Right sinus exhibits no maxillary sinus tenderness and no frontal sinus tenderness. Left sinus exhibits no maxillary sinus tenderness and no frontal sinus tenderness.  Mouth/Throat: Uvula is midline, oropharynx is clear and moist and mucous membranes are normal. No oral lesions. Dental abscesses and dental caries present. No uvula swelling or lacerations. No posterior oropharyngeal edema or posterior oropharyngeal erythema.  Eyes: Conjunctivae normal are normal. Pupils are equal, round, and reactive to light. No scleral icterus.  Neck: Trachea normal. Neck  supple.  Cardiovascular: Normal rate and regular rhythm.   Pulmonary/Chest: Effort normal and breath sounds normal.  Neurological: She is alert and oriented to person, place, and time. No cranial nerve deficit or sensory deficit.  Skin: Skin is warm and dry.  Psychiatric: She has a normal mood and affect. Her speech is normal and behavior is normal. Judgment and thought content normal. Cognition and memory are normal.     ED Course  Procedures (including critical care time)  Labs Reviewed - No data to display No results found.   1. Pain, dental   2. Dental abscess       MDM  Take medications as needed.  Continue ibuprofen, follow up with dentist for repair or removal or tooth as advised.         Johnsie Kindred, NP 10/28/11 1648

## 2011-10-29 NOTE — ED Provider Notes (Signed)
Medical screening examination/treatment/procedure(s) were performed by non-physician practitioner and as supervising physician I was immediately available for consultation/collaboration.  Leslee Home, M.D.   Reuben Likes, MD 10/29/11 1256

## 2011-11-25 ENCOUNTER — Emergency Department (INDEPENDENT_AMBULATORY_CARE_PROVIDER_SITE_OTHER)
Admission: EM | Admit: 2011-11-25 | Discharge: 2011-11-25 | Disposition: A | Discharge status: Home / Self Care | Payer: Self-pay | Source: Home / Self Care | Attending: Emergency Medicine | Admitting: Emergency Medicine

## 2011-11-25 ENCOUNTER — Encounter (HOSPITAL_COMMUNITY): Payer: Self-pay | Admitting: Emergency Medicine

## 2011-11-25 DIAGNOSIS — G5600 Carpal tunnel syndrome, unspecified upper limb: Secondary | ICD-10-CM

## 2011-11-25 DIAGNOSIS — G5603 Carpal tunnel syndrome, bilateral upper limbs: Secondary | ICD-10-CM

## 2011-11-25 MED ORDER — MELOXICAM 7.5 MG PO TABS: 7.5000 mg | ORAL_TABLET | Freq: Every day | ORAL | Status: DC

## 2011-11-25 NOTE — ED Provider Notes (Signed)
History     CSN: [id number]  Arrival date & time 11/25/11  1607   First MD Initiated Contact with Patient 11/25/11 1704      Chief Complaint  Patient presents with  . Hand Pain    pain/numbess in hands x 2 wks. more severe in right hand.    (Consider location/radiation/quality/duration/timing/severity/associated sxs/prior treatment) HPI Comments: Patient presents this evening to urgent care complaining of pain and numbness to both of her hands for about 2 weeks. She describes it in the main pain and numbness is predominantly on her right hand. Pain seems to radiate upwards towards her elbow. 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 She describes the numbness and the pain sometimes has woken her up from sleep. She denies any falls or injuries to her hands or wrists. Denies any weakness.  Patient is a 36 y.o. female presenting with hand pain. The history is provided by the patient.  Hand Pain This is a new problem. The current episode started more than 1 week ago. The problem occurs constantly. The problem has not changed since onset.Pertinent negatives include no abdominal pain. Exacerbated by: Movement and activity predominantly in her right wrist. Nothing relieves the symptoms.    Past Medical History  Diagnosis Date  . Thyroid disease     Past Surgical History  Procedure Date  . Thyroidectomy   . Tonsillectomy   . Cesarean section     Family History  Problem Relation Age of Onset  . Diabetes Other   . Hypertension Other     History  Substance Use Topics  . Smoking status: Never Smoker   . Smokeless tobacco: Not on file  . Alcohol Use: Yes     Comment: OCASSIONALLY    OB History    Grav Para Term Preterm Abortions TAB SAB Ect Mult Living                  Review of Systems  Constitutional: Positive for activity change. Negative for fever, diaphoresis, appetite change and fatigue.  Gastrointestinal:  Negative for abdominal pain.  Musculoskeletal: Positive for gait problem.  Skin: Negative for color change, pallor, rash and wound.  Neurological: Positive for numbness. Negative for dizziness and weakness.    Allergies  Review of patient's allergies indicates no known allergies.  Home Medications   Current Outpatient Rx  Name  Route  Sig  Dispense  Refill  . LEVOTHYROXINE SODIUM 25 MCG PO TABS   Oral   Take 25 mcg by mouth daily.           . MELOXICAM 7.5 MG PO TABS   Oral   Take 1 tablet (7.5 mg total) by mouth daily.   14 tablet   0   . METHOCARBAMOL 500 MG PO TABS   Oral   Take 500 mg by mouth every 8 (eight) hours as needed. For muscle spasms          . OXYBUTYNIN CHLORIDE 5 MG PO TABS   Oral   Take 1 tablet (5 mg total) by mouth 3 (three) times daily.   90 tablet   0   . PENICILLIN V POTASSIUM 500 MG PO TABS   Oral   Take 1 tablet (500 mg total) by mouth 3 (three) times daily.   30 tablet   0   . TRAMADOL HCL 50 MG PO TABS   Oral   Take 1 tablet (50  mg total) by mouth every 6 (six) hours as needed for pain.   15 tablet   0     BP 114/87  Pulse 80  Temp 98.5 F (36.9 C) (Oral)  Resp 16  SpO2 100%  LMP 11/11/2011  Physical Exam  Nursing note and vitals reviewed. Constitutional: She appears well-developed and well-nourished.  Musculoskeletal: She exhibits tenderness.       Right wrist: She exhibits decreased range of motion and tenderness. She exhibits no bony tenderness, no swelling, no effusion, no crepitus and no laceration.       Arms: Neurological: She is alert.  Skin: Skin is warm. No rash noted. No erythema. No pallor.    ED Course  Procedures (including critical care time)  Labs Reviewed - No data to display No results found.   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 No muscular weakness or vascular deficiencies noted distally. Patient was was provided a wrist splint,encouraged to use for the next 7 days. Was also prescribed a course of meloxicam for week. Was given a phone number and referral to see Dr. Merlyn Lot if pain was to persist beyond 2 weeks for further evaluation and treatment modalities office. Patient agree with treatment plan, and followup as necessary.        Jimmie Molly, MD 11/25/11 [phone]

## 2011-11-25 NOTE — ED Notes (Signed)
Pt c/o pain/numbness in both hands x two weeks. Pt states pain and numbness is more severe in right hand and pain radiates down to elbow.  Pt unable to rest at night because pain/numbness wakes her up at night.  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

## 2012-06-04 ENCOUNTER — Encounter (HOSPITAL_COMMUNITY): Payer: Self-pay | Admitting: Emergency Medicine

## 2012-06-04 ENCOUNTER — Emergency Department (HOSPITAL_COMMUNITY)
Admission: EM | Admit: 2012-06-04 | Discharge: 2012-06-04 | Disposition: A | Discharge status: Home / Self Care | Payer: Self-pay | Source: Home / Self Care

## 2012-06-04 DIAGNOSIS — G5600 Carpal tunnel syndrome, unspecified upper limb: Secondary | ICD-10-CM

## 2012-06-04 DIAGNOSIS — R197 Diarrhea, unspecified: Secondary | ICD-10-CM

## 2012-06-04 DIAGNOSIS — J029 Acute pharyngitis, unspecified: Secondary | ICD-10-CM

## 2012-06-04 DIAGNOSIS — J309 Allergic rhinitis, unspecified: Secondary | ICD-10-CM

## 2012-06-04 MED ORDER — PHENYLEPHRINE-CHLORPHEN-DM 10-4-12.5 MG/5ML PO LIQD: 5.0000 mL | ORAL | Status: DC | PRN

## 2012-06-04 MED ORDER — FLUTICASONE PROPIONATE 50 MCG/ACT NA SUSP: 2.0000 | Freq: Every day | NASAL | Status: DC

## 2012-06-04 NOTE — ED Notes (Signed)
Provided blankets and ice water ( after swabbing throat)

## 2012-06-04 NOTE — ED Provider Notes (Signed)
History     CSN: [id number]  Arrival date & time 06/04/12  1507   First MD Initiated Contact with Patient 06/04/12 [phone]      Chief Complaint  Patient presents with  . Sore Throat    (Consider location/radiation/quality/duration/timing/severity/associated sxs/prior treatment) HPI Comments: 37 year old Arabic woman complaining of sore throat, cough, feeling cold for one week. Today she started having right ear pain. She has a cough and shortness of breath but no fever. She also adds a complaint of diarrhea for 3 days having 3 stools today.   Past Medical History  Diagnosis Date  . Thyroid disease     Past Surgical History  Procedure Laterality Date  . Thyroidectomy    . Tonsillectomy    . Cesarean section      Family History  Problem Relation Age of Onset  . Diabetes Other   . Hypertension Other     History  Substance Use Topics  . Smoking status: Never Smoker   . Smokeless tobacco: Not on file  . Alcohol Use: Yes     Comment: OCASSIONALLY    OB History   Grav Para Term Preterm Abortions TAB SAB Ect Mult Living                  Review of Systems  Constitutional: Negative for fever, chills, activity change, appetite change and fatigue.  HENT: Positive for congestion, sore throat, rhinorrhea and postnasal drip. Negative for facial swelling, neck pain and neck stiffness.   Eyes: Negative.   Respiratory: Negative.   Cardiovascular: Negative.   Gastrointestinal: Positive for diarrhea. Negative for vomiting.  Musculoskeletal: Negative.   Skin: Negative.  Negative for pallor and rash.  Neurological: Negative.     Allergies  Review of patient's allergies indicates no known allergies.  Home Medications   Current Outpatient Rx  Name  Route  Sig  Dispense  Refill  . fluticasone (FLONASE) 50 MCG/ACT nasal spray   Nasal   Place 2 sprays into the nose daily. For allergies   16 g   2   . levothyroxine (SYNTHROID, LEVOTHROID) 25 MCG tablet   Oral   Take 25  mcg by mouth daily.           . meloxicam (MOBIC) 7.5 MG tablet   Oral   Take 1 tablet (7.5 mg total) by mouth daily.   14 tablet   0   . methocarbamol (ROBAXIN) 500 MG tablet   Oral   Take 500 mg by mouth every 8 (eight) hours as needed. For muscle spasms          . EXPIRED: oxybutynin (DITROPAN) 5 MG tablet   Oral   Take 1 tablet (5 mg total) by mouth 3 (three) times daily.   90 tablet   0   . penicillin v potassium (VEETID) 500 MG tablet   Oral   Take 1 tablet (500 mg total) by mouth 3 (three) times daily.   30 tablet   0   . Phenylephrine-Chlorphen-DM 10-08-10.5 MG/5ML LIQD   Oral   Take 5 mLs by mouth every 4 (four) hours as needed.   120 mL   0   . traMADol (ULTRAM) 50 MG tablet   Oral   Take 1 tablet (50 mg total) by mouth every 6 (six) hours as needed for pain.   15 tablet   0     BP 100/60  Pulse 85  Temp(Src) 98.5 F (36.9 C) (Oral)  Resp 20  SpO2  100%  LMP 05/05/2012  Physical Exam  Nursing note and vitals reviewed. Constitutional: She is oriented to person, place, and time. She appears well-developed and well-nourished. No distress.  HENT:  Mouth/Throat: No oropharyngeal exudate.  R TM is normal. L TM retracted, no bulging. OP with cobblestoning, mild erythema, no exudates  Eyes: Conjunctivae and EOM are normal.  Neck: Normal range of motion. Neck supple.  Cardiovascular: Normal rate and regular rhythm.   Pulmonary/Chest: Effort normal and breath sounds normal. No respiratory distress. She has no wheezes. She has no rales.  Musculoskeletal: Normal range of motion. She exhibits no edema.  Lymphadenopathy:    She has no cervical adenopathy.  Neurological: She is alert and oriented to person, place, and time.  Skin: Skin is warm and dry. No rash noted.  Psychiatric: She has a normal mood and affect.    ED Course  Procedures (including critical care time)  Labs Reviewed  POCT RAPID STREP A ([facility]ARE ONLY)   No results found.   1.  Allergic rhinitis due to allergen   2. Allergic pharyngitis   3. Diarrhea       MDM  Norell CS 1 teaspoon every 4 hours when necessary Fluticasone nasal spray as directed Drink plenty of fluids stay well hydrated Or new symptoms problems or worsening may return. Diarrhea may be due to the Amoxicillin or the PND and swallowing it.   Hayden Rasmussen, NP 06/04/12 [phone]

## 2012-06-04 NOTE — ED Notes (Signed)
Sore throat, headache, body aches, right ear pain ( pinch, intermittent) . Symptoms for a week.

## 2012-06-06 NOTE — ED Provider Notes (Signed)
Medical screening examination/treatment/procedure(s) were performed by resident physician or non-physician practitioner and as supervising physician I was immediately available for consultation/collaboration.   Barkley Bruns MD.   Linna Hoff, MD 06/06/12 1350

## 2012-07-05 ENCOUNTER — Emergency Department (HOSPITAL_COMMUNITY)
Admission: EM | Admit: 2012-07-05 | Discharge: 2012-07-05 | Disposition: A | Discharge status: Home / Self Care | Payer: Medicaid Other | Source: Home / Self Care

## 2012-07-05 ENCOUNTER — Encounter (HOSPITAL_COMMUNITY): Payer: Self-pay | Admitting: *Deleted

## 2012-07-05 DIAGNOSIS — J302 Other seasonal allergic rhinitis: Secondary | ICD-10-CM

## 2012-07-05 DIAGNOSIS — H6691 Otitis media, unspecified, right ear: Secondary | ICD-10-CM

## 2012-07-05 DIAGNOSIS — H6690 Otitis media, unspecified, unspecified ear: Secondary | ICD-10-CM

## 2012-07-05 DIAGNOSIS — J309 Allergic rhinitis, unspecified: Secondary | ICD-10-CM

## 2012-07-05 MED ORDER — AMOXICILLIN-POT CLAVULANATE 875-125 MG PO TABS: 1.0000 | ORAL_TABLET | Freq: Two times a day (BID) | ORAL | Status: DC

## 2012-07-05 MED ORDER — FEXOFENADINE-PSEUDOEPHED ER 60-120 MG PO TB12: 1.0000 | ORAL_TABLET | Freq: Two times a day (BID) | ORAL | Status: DC

## 2012-07-05 NOTE — ED Notes (Signed)
Pt  Has  Symptoms  Of  Bilateral   Eye  Irritation  And         r  Earache  X  2  Days   She  Also  Reports  Her  r  Lower  Gum is  Sore

## 2012-07-05 NOTE — ED Provider Notes (Signed)
Medical screening examination/treatment/procedure(s) were performed by non-physician practitioner and as supervising physician I was immediately available for consultation/collaboration.  Leslee Home, M.D.  Reuben Likes, MD 07/05/12 2031

## 2012-07-05 NOTE — ED Provider Notes (Signed)
History    CSN: [id number] Arrival date & time 07/05/12  1341  First MD Initiated Contact with Patient 07/05/12 1608     Chief Complaint  Patient presents with  . Otalgia   (Consider location/radiation/quality/duration/timing/severity/associated sxs/prior Treatment) HPI  37 yo female presents today with ichy eyes,cough and right ear pain.  States that symptoms ongoing for a couple of days.  Also says that her right lower gums are sore.  Denies fever, chills, hearing loss, ear discharge, headache, productive cough, cp, sob.  Eyes are watery.  Was seen in the clinic about a month ago with allergy symptoms.   Past Medical History  Diagnosis Date  . Thyroid disease    Past Surgical History  Procedure Laterality Date  . Thyroidectomy    . Tonsillectomy    . Cesarean section     Family History  Problem Relation Age of Onset  . Diabetes Other   . Hypertension Other    History  Substance Use Topics  . Smoking status: Never Smoker   . Smokeless tobacco: Not on file  . Alcohol Use: Yes     Comment: OCASSIONALLY   OB History   Grav Para Term Preterm Abortions TAB SAB Ect Mult Living                 Review of Systems  Constitutional: Negative.   HENT: Positive for ear pain and congestion. Negative for hearing loss, nosebleeds, rhinorrhea, sneezing, postnasal drip, tinnitus and ear discharge.   Eyes: Positive for redness and itching. Negative for photophobia, discharge and visual disturbance.  Respiratory: Positive for cough.   Cardiovascular: Negative.   Gastrointestinal: Negative.   Endocrine: Negative.   Genitourinary: Negative.   Skin: Negative.   Neurological: Negative.   Hematological: Negative.   Psychiatric/Behavioral: Negative.     Allergies  Review of patient's allergies indicates no known allergies.  Home Medications   Current Outpatient Rx  Name  Route  Sig  Dispense  Refill  . amoxicillin-clavulanate (AUGMENTIN) 875-125 MG per tablet   Oral   Take 1  tablet by mouth 2 (two) times daily.   20 tablet   0   . fexofenadine-pseudoephedrine (ALLEGRA-D) 60-120 MG per tablet   Oral   Take 1 tablet by mouth every 12 (twelve) hours.   30 tablet   0   . fluticasone (FLONASE) 50 MCG/ACT nasal spray   Nasal   Place 2 sprays into the nose daily. For allergies   16 g   2   . levothyroxine (SYNTHROID, LEVOTHROID) 25 MCG tablet   Oral   Take 25 mcg by mouth daily.           . meloxicam (MOBIC) 7.5 MG tablet   Oral   Take 1 tablet (7.5 mg total) by mouth daily.   14 tablet   0   . methocarbamol (ROBAXIN) 500 MG tablet   Oral   Take 500 mg by mouth every 8 (eight) hours as needed. For muscle spasms          . EXPIRED: oxybutynin (DITROPAN) 5 MG tablet   Oral   Take 1 tablet (5 mg total) by mouth 3 (three) times daily.   90 tablet   0   . penicillin v potassium (VEETID) 500 MG tablet   Oral   Take 1 tablet (500 mg total) by mouth 3 (three) times daily.   30 tablet   0   . Phenylephrine-Chlorphen-DM 10-08-10.5 MG/5ML LIQD   Oral  Take 5 mLs by mouth every 4 (four) hours as needed.   120 mL   0   . traMADol (ULTRAM) 50 MG tablet   Oral   Take 1 tablet (50 mg total) by mouth every 6 (six) hours as needed for pain.   15 tablet   0    BP 131/84  Pulse 70  Temp(Src) 98.2 F (36.8 C) (Oral)  Resp 20  SpO2 100% Physical Exam  Constitutional: She is oriented to person, place, and time. She appears well-developed and well-nourished.  HENT:  Head: Normocephalic and atraumatic.  Mouth/Throat: Oropharynx is clear and moist.  Right ear otoscope shows some fluid behind TM.  Blunted light reflex. No drainage.  Ear canal sore.   No obvious oral gum swelling or drainage.    Eyes: EOM are normal. Pupils are equal, round, and reactive to light. Right eye exhibits discharge. Left eye exhibits no discharge. Scleral icterus is present.  Neck: Normal range of motion.  Cardiovascular: Normal rate and regular rhythm.    Pulmonary/Chest: Effort normal and breath sounds normal.  Musculoskeletal: Normal range of motion.  Neurological: She is alert and oriented to person, place, and time.  Skin: Skin is warm.  Psychiatric: She has a normal mood and affect.    ED Course  Procedures (including critical care time) Labs Reviewed - No data to display No results found. 1. Otitis media, right   2. Seasonal allergies     MDM  Patient will f/u in clinic 3-5 days if symptoms worsen or not improved.  Sooner if needed.  Instructions given and she voices understanding.    Meds ordered this encounter  Medications  . fexofenadine-pseudoephedrine (ALLEGRA-D) 60-120 MG per tablet    Sig: Take 1 tablet by mouth every 12 (twelve) hours.    Dispense:  30 tablet    Refill:  0  . amoxicillin-clavulanate (AUGMENTIN) 875-125 MG per tablet    Sig: Take 1 tablet by mouth 2 (two) times daily.    Dispense:  20 tablet    Refill:  0    Zonia Kief, PA-C 07/05/12 2030

## 2013-09-13 ENCOUNTER — Encounter (HOSPITAL_COMMUNITY): Payer: Self-pay | Admitting: Emergency Medicine

## 2013-09-13 ENCOUNTER — Emergency Department (INDEPENDENT_AMBULATORY_CARE_PROVIDER_SITE_OTHER)
Admission: EM | Admit: 2013-09-13 | Discharge: 2013-09-13 | Disposition: A | Discharge status: Home / Self Care | Payer: Self-pay | Source: Home / Self Care | Attending: Family Medicine | Admitting: Family Medicine

## 2013-09-13 DIAGNOSIS — M2240 Chondromalacia patellae, unspecified knee: Secondary | ICD-10-CM

## 2013-09-13 MED ORDER — DICLOFENAC SODIUM 50 MG PO TBEC: 50.0000 mg | DELAYED_RELEASE_TABLET | Freq: Two times a day (BID) | ORAL | Status: DC | PRN

## 2013-09-13 NOTE — ED Provider Notes (Signed)
Penny Walker is a 38 y.o. female who presents to Urgent Care today for knee pain. Patient has 2-3 month history of bilateral anterior knee pain. The pain is worse with bending and squatting. She denies any injury. She has tried ibuprofen which helps a little. No radiating pain weakness or numbness. No fevers or chills.   Past Medical History  Diagnosis Date  . Thyroid disease    History  Substance Use Topics  . Smoking status: Never Smoker   . Smokeless tobacco: Not on file  . Alcohol Use: Yes     Comment: OCASSIONALLY   ROS as above Medications: No current facility-administered medications for this encounter.   Current Outpatient Prescriptions  Medication Sig Dispense Refill  . levothyroxine (SYNTHROID, LEVOTHROID) 25 MCG tablet Take 25 mcg by mouth daily.        Marland Kitchen amoxicillin-clavulanate (AUGMENTIN) 875-125 MG per tablet Take 1 tablet by mouth 2 (two) times daily.  20 tablet  0  . diclofenac (VOLTAREN) 50 MG EC tablet Take 1 tablet (50 mg total) by mouth 2 (two) times daily as needed.  60 tablet  0  . fexofenadine-pseudoephedrine (ALLEGRA-D) 60-120 MG per tablet Take 1 tablet by mouth every 12 (twelve) hours.  30 tablet  0  . fluticasone (FLONASE) 50 MCG/ACT nasal spray Place 2 sprays into the nose daily. For allergies  16 g  2  . meloxicam (MOBIC) 7.5 MG tablet Take 1 tablet (7.5 mg total) by mouth daily.  14 tablet  0  . methocarbamol (ROBAXIN) 500 MG tablet Take 500 mg by mouth every 8 (eight) hours as needed. For muscle spasms       . oxybutynin (DITROPAN) 5 MG tablet Take 1 tablet (5 mg total) by mouth 3 (three) times daily.  90 tablet  0  . penicillin v potassium (VEETID) 500 MG tablet Take 1 tablet (500 mg total) by mouth 3 (three) times daily.  30 tablet  0  . Phenylephrine-Chlorphen-DM 10-08-10.5 MG/5ML LIQD Take 5 mLs by mouth every 4 (four) hours as needed.  120 mL  0  . traMADol (ULTRAM) 50 MG tablet Take 1 tablet (50 mg total) by mouth every 6 (six) hours as needed for  pain.  15 tablet  0    Exam:  BP 132/80  Pulse 74  Temp(Src) 98.1 F (36.7 C) (Oral)  Resp 16  SpO2 100%  LMP 09/08/2013 Gen: Well NAD obese HEENT: EOMI,  MMM Knees bilaterally: No effusion Range of motion 0-120 with 1+ retropatellar crepitations. Stable ligamentous exam. Strength is intact Normal gait  No results found for this or any previous visit (from the past 24 hour(s)). No results found.  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 Discussed quad and VMO strengthening exercises. Diclofenac for pain. Followup with sports medicine.  Discussed warning signs or symptoms. Please see discharge instructions. Patient expresses understanding.   This note was created using Conservation officer, historic buildings. Any transcription errors are unintended.    Rodolph Bong, MD 09/13/13 [phone]

## 2013-09-13 NOTE — Discharge Instructions (Signed)
Thank you for coming in today. Do the exercises we talked about twice daily at least 30 repetitions Take diclofenac for pain as needed   Please call or see Ms Antionette Char for assistance with your bill.  You may qualify for reduced or free services.  Her phone number is [phone]. Her email is [email or web address]   Excessive Lateral Patellar Compression Syndrome with Rehab Excessive lateral patellar compression syndrome involves an increase in pressure in the knee joint, that causes pain. The kneecap (patella) is a v-shaped bone that usually glides through the center of the groove in the thigh bone (trochlea). For people with this condition, the kneecap presses more on the outer (lateral) side of the groove, causing an increase in pressure and pain in the knee. This condition usually occurs without injury, although it may also follow an injury. SYMPTOMS   General, spread out knee pain, most commonly in the front half of the knee, behind the kneecap, or in the very back of the knee. Less commonly, the pain may be above or below the kneecap.  Pain that gets worse with sitting for long periods, rising from a sitting position, going up or down stairs or hills, kneeling, squatting, or wearing shoes with heels.  Often, pain with jumping.  Often, an aching pain.  Giving way, catching of the knee.  Minimal or no swelling, no locking. CAUSES  Excessive lateral patellar compression syndrome is caused by weakness in the thigh (quadriceps) muscles, that causes the kneecap to track poorly within the knee joint. The poor tracking of the kneecap may also occur in people who have an improper alignment of the knee and leg (bowlegged, knock knees). The poor tracking of the kneecap results in an increase in pressure on the outer side of the knee. The membrane that holds the kneecap in place (retinaculum) is stretched, which causes pain. The pain gets worse with use of the thigh  (quadriceps) muscle. RISK INCREASES WITH:  Tight back of the thigh (hamstring), front of the thigh (quadriceps), or calf muscles.  Weak front of the thigh (quadriceps) muscles.  Failure to warm up properly before activity.  Sports that involve running, jumping, or squatting.  Poor alignment of the legs (bowlegged, knock knees).  Born with (congenital) malformation of the kneecap or thigh bone groove (trochlea).  Previous injury or surgery to the knee.  Direct injury to the kneecap (falling on the kneecap). PREVENTION   Warm up and stretch properly before activity.  Maintain physical fitness:  Strength, flexibility, and endurance.  Cardiovascular fitness.  Use arch supports (orthotics) or knee pads. PROGNOSIS  If treated properly, excessive lateral patellar compression syndrome is usually curable. If left untreated, the condition does not usually result in a permanent condition. RELATED COMPLICATIONS   Frequently recurring symptoms and disability, severe enough to decrease an athlete's competitive ability.  Arthritis of the kneecap.  Kneecap dislocations.  Risks of surgery: infection, bleeding, injury to nerves (numbness, weakness, paralysis), knee stiffness, dislocation of the kneecap, weakness, continued pain, compartment syndrome (if surgery is performed to cut the bone of the leg and move it). TREATMENT  Treatment first involves ice and medicine, to reduce pain and inflammation. Since weak muscles often cause the condition, it is important to complete strengthening and stretching exercises to help the kneecap track properly in the thigh bone groove. These exercises may be performed at home or with a therapist. Your caregiver may recommend that you wear a knee brace, to help the kneecap track properly. For  people with flat feet, orthotics may be advised. If non-surgical treatment is unsuccessful, surgery may be needed. Surgery involves cutting the outer side (lateral  release) of the connecting band (retinaculum), with or without tightening the retinaculum on the inner side of the knee. Sometimes, surgery to cut the bony bump below the kneecap (tibial tubercle) and move it may be required (insertion of the patellar tendon into bone).  MEDICATION  If pain medicine is needed, nonsteroidal anti-inflammatory medicines (aspirin and ibuprofen), or other minor pain relievers (acetaminophen), are often advised.  Do not take pain medicine for 7 days before surgery.  Prescription pain relievers may be given, if your caregiver thinks they are needed. Use only as directed and only as much as you need.  Corticosteroid injections are rarely used for this condition. HEAT AND COLD  Cold treatment (icing) should be applied for 10 to 15 minutes every 2 to 3 hours for inflammation and pain, and immediately after activity that aggravates your symptoms. Use ice packs or an ice massage.  Heat treatment may be used before performing stretching and strengthening activities prescribed by your caregiver, physical therapist, or athletic trainer. Use a heat pack or a warm water soak. SEEK MEDICAL CARE IF:  Symptoms get worse or do not improve in 6 to 8 weeks, despite treatment.  Any of the following occur after surgery:  Pain, numbness, coldness, or discoloration (blue, gray, or dark color) in the foot.  Fever, increased pain, swelling, redness, drainage of fluids, or bleeding in the affected area.  New, unexplained symptoms develop. (Drugs used in treatment may produce side effects.)

## 2013-09-13 NOTE — ED Notes (Signed)
C/o bilateral knee pain.  Pain worse in left knee.  Denies injury. Symptoms present x 1 month.  Mild relief with ibuprofen and muscle relaxer.

## 2013-09-25 IMAGING — CR DG FOOT COMPLETE 3+V*R*
3 series · 3 of 3 positions shown · non-contrast
Comparison: None.

CLINICAL DATA: Injury, pain.

RIGHT FOOT COMPLETE - 3+ VIEW

[view not recorded (1 of 3)]
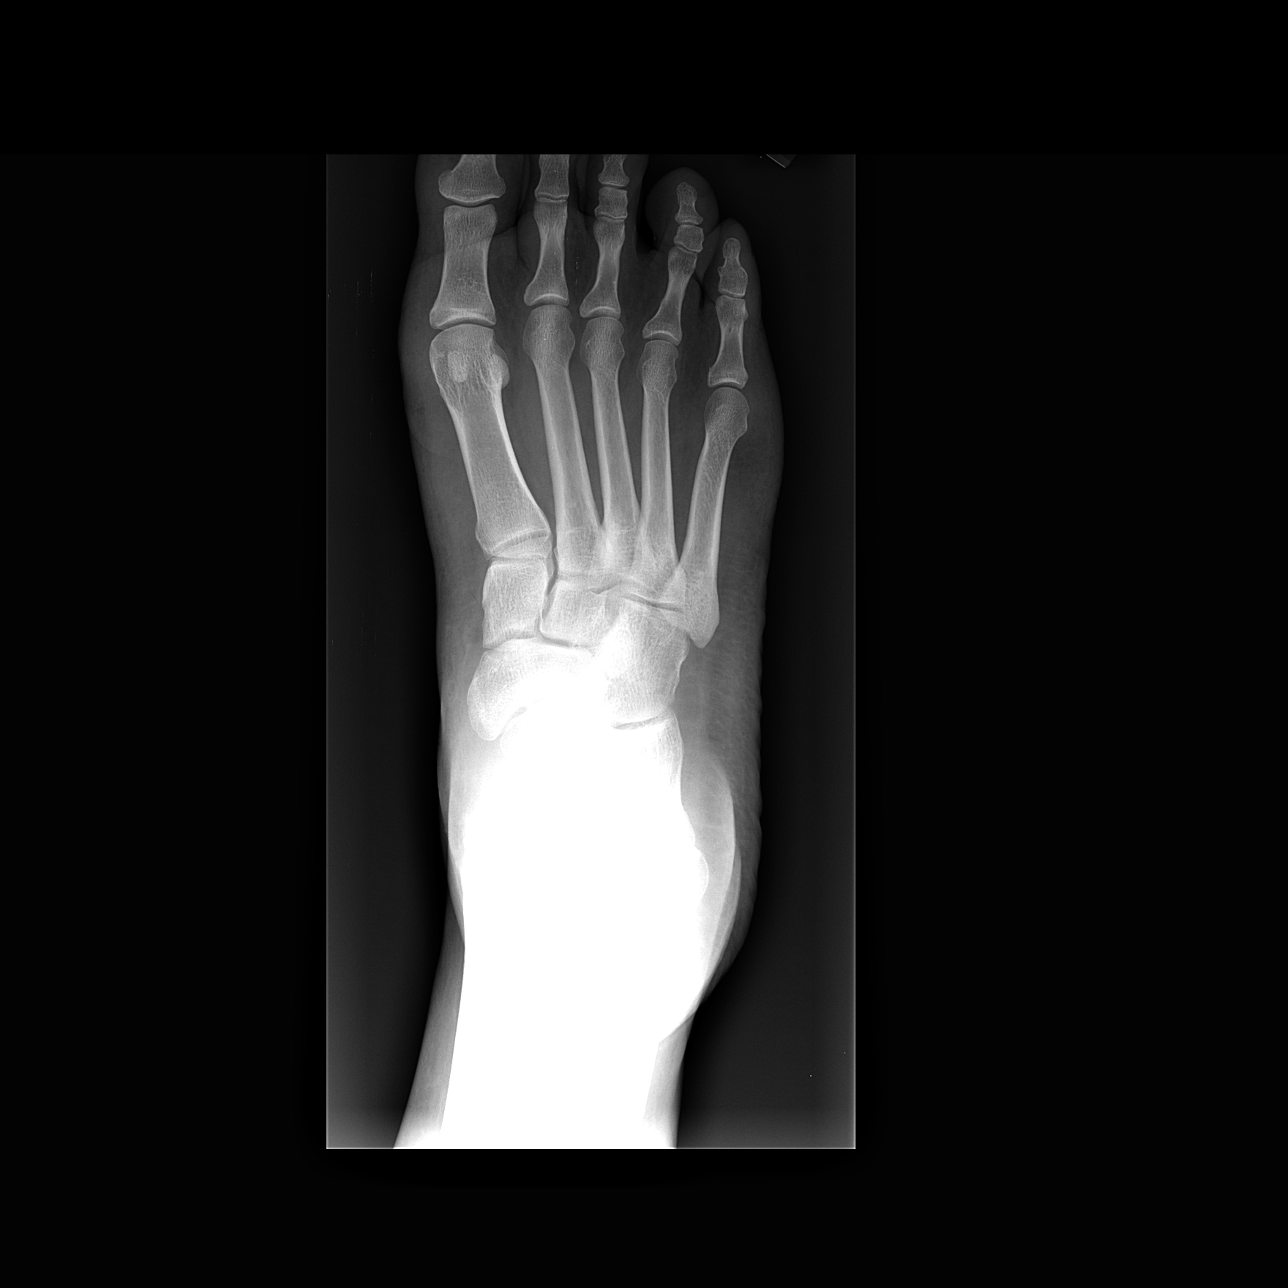

[view not recorded (2 of 3)]
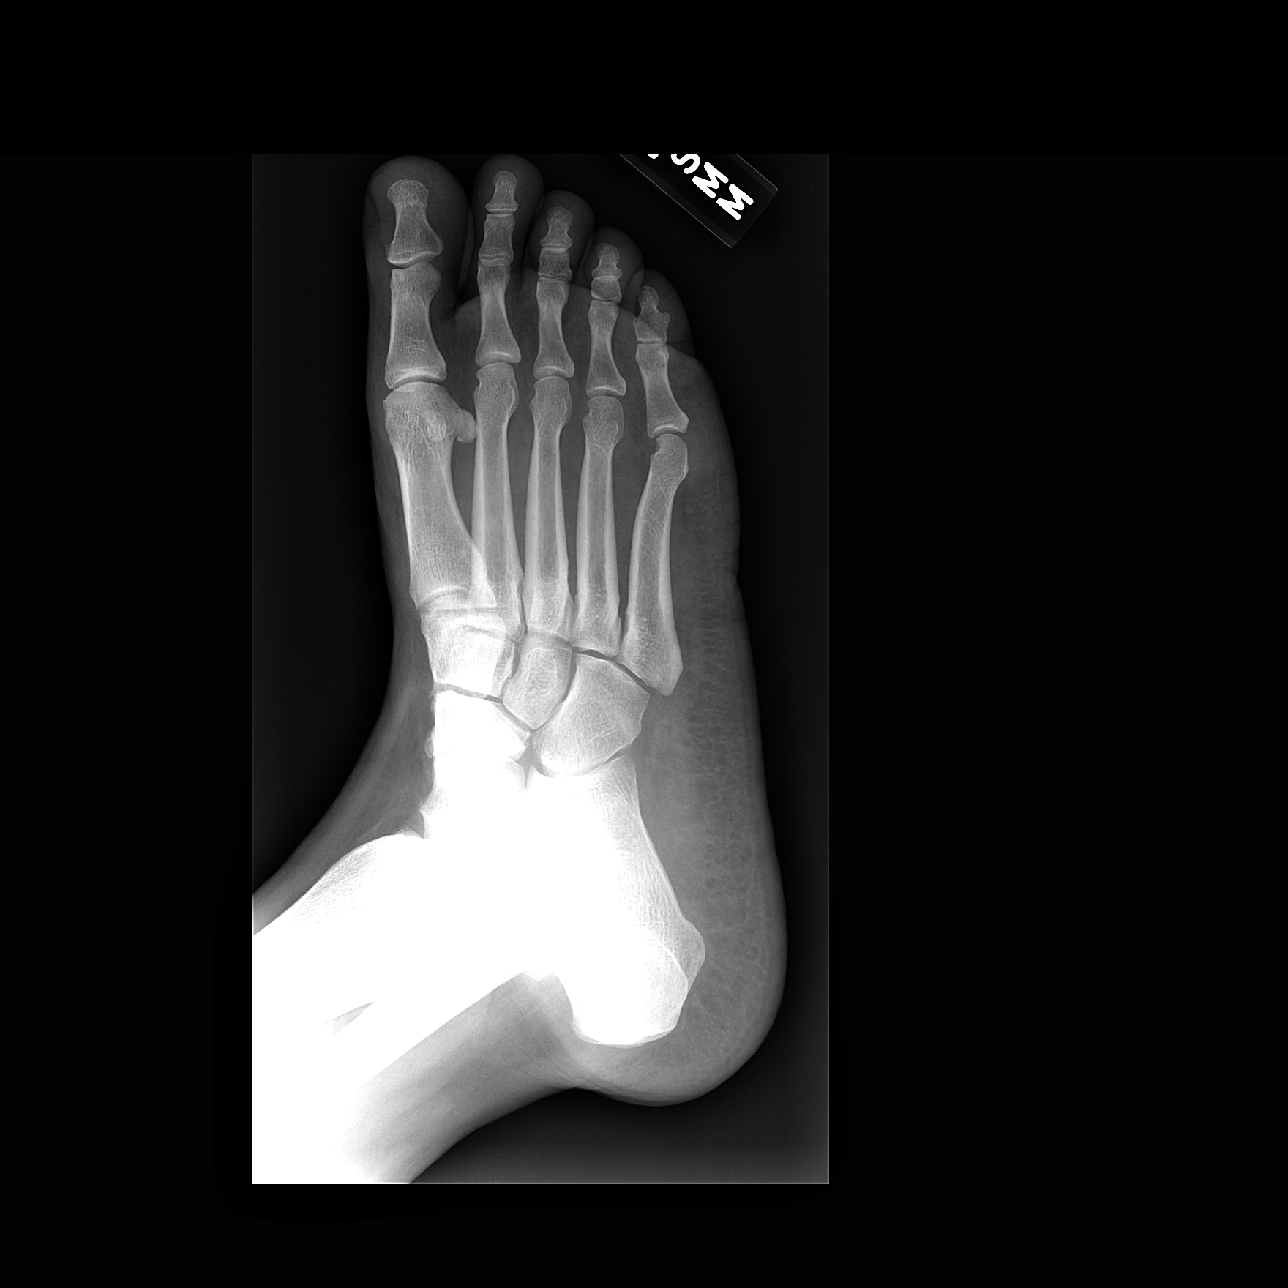

[view not recorded (3 of 3)]
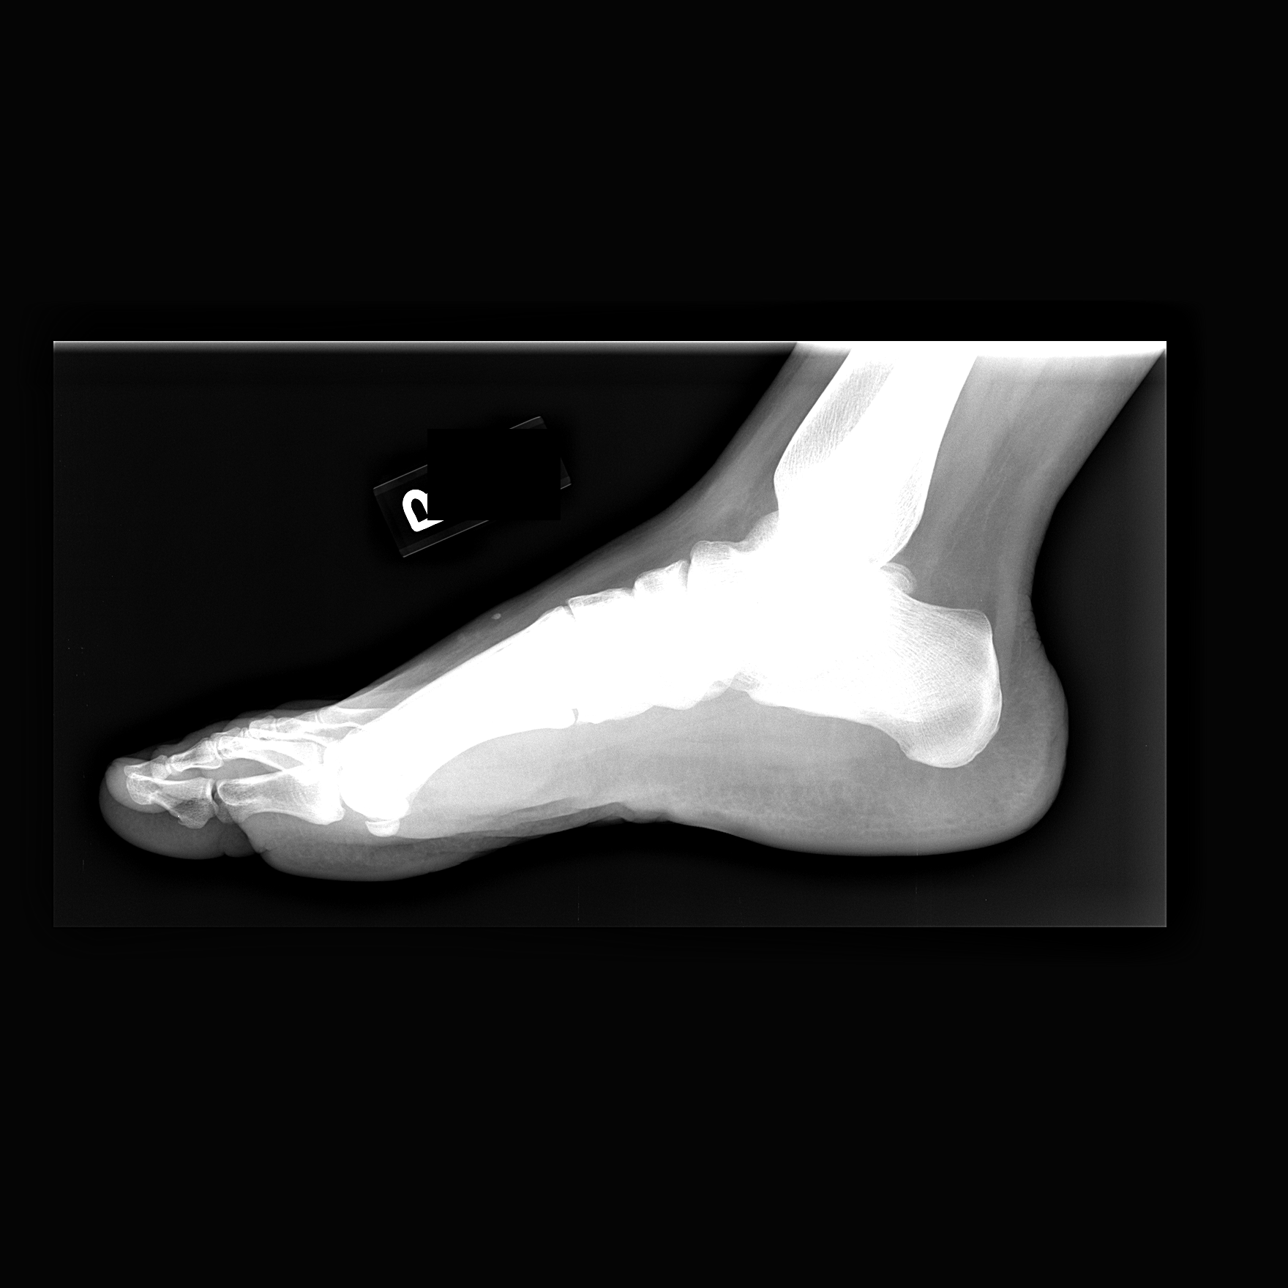

[3 of 3 positions shown; findings below may reference images not displayed]

FINDINGS: Imaged bones, joints and soft tissues appear normal.
IMPRESSION: Negative exam.

## 2014-09-10 ENCOUNTER — Other Ambulatory Visit (HOSPITAL_COMMUNITY): Payer: Self-pay | Admitting: *Deleted

## 2014-09-10 DIAGNOSIS — N6459 Other signs and symptoms in breast: Secondary | ICD-10-CM

## 2014-09-10 DIAGNOSIS — N6452 Nipple discharge: Secondary | ICD-10-CM

## 2014-09-18 ENCOUNTER — Encounter (HOSPITAL_COMMUNITY): Payer: Self-pay | Admitting: *Deleted

## 2014-09-19 ENCOUNTER — Ambulatory Visit (HOSPITAL_COMMUNITY)
Admission: RE | Admit: 2014-09-19 | Discharge: 2014-09-19 | Disposition: A | Discharge status: Home / Self Care | Payer: Self-pay | Source: Ambulatory Visit | Attending: Obstetrics and Gynecology | Admitting: Obstetrics and Gynecology

## 2014-09-19 ENCOUNTER — Encounter (HOSPITAL_COMMUNITY): Payer: Self-pay

## 2014-09-19 ENCOUNTER — Encounter: Payer: Self-pay | Admitting: Adult Health

## 2014-09-19 ENCOUNTER — Other Ambulatory Visit (HOSPITAL_COMMUNITY): Payer: Self-pay | Admitting: *Deleted

## 2014-09-19 VITALS — BP 102/64 | Temp 98.1°F | Ht 67.0 in | Wt 186.0 lb

## 2014-09-19 DIAGNOSIS — Z1239 Encounter for other screening for malignant neoplasm of breast: Secondary | ICD-10-CM

## 2014-09-19 DIAGNOSIS — N6452 Nipple discharge: Secondary | ICD-10-CM

## 2014-09-19 DIAGNOSIS — N6310 Unspecified lump in the right breast, unspecified quadrant: Secondary | ICD-10-CM

## 2014-09-19 NOTE — Progress Notes (Signed)
CLINIC:  [facility] [employer]) [facility]  REASON FOR VISIT: Well-woman exam and diagnostic mammogram.    HISTORY OF PRESENT ILLNESS:  Penny Walker is a 39 y.o. female who presents to the [facility] today for clinical breast exam. No family history of breast cancer. She reports nipple inversion and foul-smelling left nipple yellow discharge x 11 years, since the birth of her last child.  She states that the left nipple discharge is sometimes bloody.  Patient unclear if she has felt a lump.  Denies any breast skin changes to either breast.  She had a diagnostic mammogram in 06/2010 that was benign and recommended screening mammogram at age 62.  She also reports having a right breast biopsy "several years ago", which showed a cyst. Her last pap smear was in 08/2014 and was negative.  She had an abnormal pap for 15 years.   REVIEW OF SYSTEMS:  Per HPI.   ALLERGIES: No Known Allergies  CURRENT MEDICATIONS:  Current Outpatient Prescriptions on File Prior to Encounter  Medication Sig Dispense Refill  . levothyroxine (SYNTHROID, LEVOTHROID) 25 MCG tablet Take 25 mcg by mouth daily.      Marland Kitchen amoxicillin-clavulanate (AUGMENTIN) 875-125 MG per tablet Take 1 tablet by mouth 2 (two) times daily. (Patient not taking: Reported on 09/19/2014) 20 tablet 0  . diclofenac (VOLTAREN) 50 MG EC tablet Take 1 tablet (50 mg total) by mouth 2 (two) times daily as needed. (Patient not taking: Reported on 09/19/2014) 60 tablet 0  . fexofenadine-pseudoephedrine (ALLEGRA-D) 60-120 MG per tablet Take 1 tablet by mouth every 12 (twelve) hours. (Patient not taking: Reported on 09/19/2014) 30 tablet 0  . fluticasone (FLONASE) 50 MCG/ACT nasal spray Place 2 sprays into the nose daily. For allergies (Patient not taking: Reported on 09/19/2014) 16 g 2  . meloxicam (MOBIC) 7.5 MG tablet Take 1 tablet (7.5 mg total) by mouth daily. (Patient not taking: Reported on 09/19/2014) 14 tablet 0  . methocarbamol (ROBAXIN)  500 MG tablet Take 500 mg by mouth every 8 (eight) hours as needed. For muscle spasms     . oxybutynin (DITROPAN) 5 MG tablet Take 1 tablet (5 mg total) by mouth 3 (three) times daily. 90 tablet 0  . penicillin v potassium (VEETID) 500 MG tablet Take 1 tablet (500 mg total) by mouth 3 (three) times daily. (Patient not taking: Reported on 09/19/2014) 30 tablet 0  . Phenylephrine-Chlorphen-DM 10-08-10.5 MG/5ML LIQD Take 5 mLs by mouth every 4 (four) hours as needed. (Patient not taking: Reported on 09/19/2014) 120 mL 0  . traMADol (ULTRAM) 50 MG tablet Take 1 tablet (50 mg total) by mouth every 6 (six) hours as needed for pain. (Patient not taking: Reported on 09/19/2014) 15 tablet 0   No current facility-administered medications on file prior to encounter.     PHYSICAL EXAM:  Vitals:  Filed Vitals:   09/19/14 1613  BP: 102/64  Temp: 98.1 F (36.7 C)   General: Well-nourished, well-appearing female in no acute distress.  She is unaccompanied in clinic today.  Stoney Bang, LPN was present during physical exam for this patient.  Breasts: Bilateral breasts exposed and observed with patient standing (arms at side, arms on hips, arms on hips flexed forward, and arms over head).  No gross abnormalities including breast skin puckering or dimpling noted on observation.  Breasts symmetrical without evidence of skin redness, thickening, or peau d'orange appearance.  Left breast:  Left breast with small, sub-centimeter nodule about 10 cm from  nipple. Left nipple inverted.  Attempted to express fluid from left nipple; white, foul-smelling discharge present prior to attempted to express fluid.  Able to express tiny drop of white, milky discharge.  Right breast: Oval, round mass measuring 1 x 0.5 cm palpated at 6 o'clock position, about 3 cm from nipple.  Axillary lymph nodes: No axillary lymphadenopathy bilaterally.   GU: Exam deferred. Pap smear is up-to-date.  ASSESSMENT & PLAN:   1. Breast cancer  screening: Ms. Bole has a few palpable abnormalities on her clinical breast exam today.  She will receive her diagnostic mammogram and potential bilateral breast ultrasound as scheduled.  She will be contacted by the imaging center for results of the mammogram and/or ultrasound. She was given instructions and educational materials regarding breast self-awareness. Ms. Wist is aware of this plan and agrees with it.   2. Unilateral (left) discharge/inversion: Left nipple discharge expressed and sent to lab.  The foul-smelling discharge is likely due to skin-to-skin contact from the nipple inversion and difficulty in keeping area clean.  We discussed the importance of keeping the nipple clean and dry, as much as possible. We also discussed potential causes of nipple inversion such as masses, cysts, scarring, etc.  Another potential differential diagnosis is breast abscess, contributing to the nipple inversion and discharge.  We will wait for the results from both the mammogram/ultrasound and lab.  We will be in touch with Ms. Schelling regarding the lab results.   Ms. Dettmann was encouraged to ask questions and all questions were answered to her satisfaction.    Lubertha Basque, NP [facility]  [phone]

## 2014-09-20 ENCOUNTER — Ambulatory Visit
Admission: RE | Admit: 2014-09-20 | Discharge: 2014-09-20 | Disposition: A | Discharge status: Home / Self Care | Payer: No Typology Code available for payment source | Source: Ambulatory Visit | Attending: Obstetrics and Gynecology | Admitting: Obstetrics and Gynecology

## 2014-09-20 DIAGNOSIS — N6459 Other signs and symptoms in breast: Secondary | ICD-10-CM

## 2014-09-20 DIAGNOSIS — N6452 Nipple discharge: Secondary | ICD-10-CM

## 2014-09-25 ENCOUNTER — Telehealth (HOSPITAL_COMMUNITY): Payer: Self-pay | Admitting: *Deleted

## 2014-09-25 NOTE — Telephone Encounter (Signed)
Telephoned patient and discussed benign breast discharge results. Patient voiced understanding.

## 2015-07-05 HISTORY — PX: REDUCTION MAMMAPLASTY: SUR839

## 2015-08-29 ENCOUNTER — Encounter (HOSPITAL_COMMUNITY): Payer: Self-pay | Admitting: Emergency Medicine

## 2015-08-29 DIAGNOSIS — R109 Unspecified abdominal pain: Secondary | ICD-10-CM | POA: Diagnosis present

## 2015-08-29 DIAGNOSIS — T814XXA Infection following a procedure, initial encounter: Secondary | ICD-10-CM | POA: Diagnosis not present

## 2015-08-29 DIAGNOSIS — Z5321 Procedure and treatment not carried out due to patient leaving prior to being seen by health care provider: Secondary | ICD-10-CM | POA: Insufficient documentation

## 2015-08-29 DIAGNOSIS — R1010 Upper abdominal pain, unspecified: Secondary | ICD-10-CM

## 2015-08-29 DIAGNOSIS — Z79899 Other long term (current) drug therapy: Secondary | ICD-10-CM | POA: Diagnosis not present

## 2015-08-29 DIAGNOSIS — Y828 Other medical devices associated with adverse incidents: Secondary | ICD-10-CM | POA: Diagnosis not present

## 2015-08-29 LAB — COMPREHENSIVE METABOLIC PANEL
ALBUMIN: 3.6 g/dL (ref 3.5–5.0)
ALT: 10 U/L — ABNORMAL LOW (ref 14–54)
ANION GAP: 3 — AB (ref 5–15)
AST: 15 U/L (ref 15–41)
Alkaline Phosphatase: 48 U/L (ref 38–126)
BUN: 12 mg/dL (ref 6–20)
CALCIUM: 9.2 mg/dL (ref 8.9–10.3)
CO2: 26 mmol/L (ref 22–32)
Chloride: 110 mmol/L (ref 101–111)
Creatinine, Ser: 0.66 mg/dL (ref 0.44–1.00)
GFR calc non Af Amer: >60 mL/min (ref >60)
GLUCOSE: 93 mg/dL (ref 65–99)
POTASSIUM: 3.8 mmol/L (ref 3.5–5.1)
SODIUM: 139 mmol/L (ref 135–145)
Total Bilirubin: 0.2 mg/dL — ABNORMAL LOW (ref 0.3–1.2)
Total Protein: 6.4 g/dL — ABNORMAL LOW (ref 6.5–8.1)

## 2015-08-29 LAB — URINALYSIS, ROUTINE W REFLEX MICROSCOPIC
BILIRUBIN URINE: NEGATIVE
Glucose, UA: NEGATIVE mg/dL
Ketones, ur: NEGATIVE mg/dL
Leukocytes, UA: NEGATIVE
NITRITE: NEGATIVE
PH: 5.5 (ref 5.0–8.0)
Protein, ur: NEGATIVE mg/dL
SPECIFIC GRAVITY, URINE: 1.028 (ref 1.005–1.030)

## 2015-08-29 LAB — CBC
HEMATOCRIT: 29.7 % — AB (ref 36.0–46.0)
HEMOGLOBIN: 8.9 g/dL — AB (ref 12.0–15.0)
MCH: 25 pg — AB (ref 26.0–34.0)
MCHC: 30 g/dL (ref 30.0–36.0)
MCV: 83.4 fL (ref 78.0–100.0)
Platelets: 307 10*3/uL (ref 150–400)
RBC: 3.56 MIL/uL — AB (ref 3.87–5.11)
RDW: 16 % — ABNORMAL HIGH (ref 11.5–15.5)
WBC: 6.2 10*3/uL (ref 4.0–10.5)

## 2015-08-29 LAB — URINE MICROSCOPIC-ADD ON

## 2015-08-29 LAB — LIPASE, BLOOD: LIPASE: 31 U/L (ref 11–51)

## 2015-08-29 LAB — I-STAT CG4 LACTIC ACID, ED: LACTIC ACID, VENOUS: 0.67 mmol/L (ref 0.5–1.9)

## 2015-08-29 LAB — POC URINE PREG, ED: PREG TEST UR: NEGATIVE

## 2015-08-29 NOTE — ED Triage Notes (Addendum)
C/o mid upper abd pain with nausea and vomiting that starts approx 10 min after eating and last for 3 hours.  States symptoms started approx 3 weeks ago.  Reports having an endoscopy 3 weeks ago in [place] for same.  Also reports she had R breast surgery 3 weeks ago in [place] and took 3 different antibiotics after surgery.  States she is now having discharge from suture site and c/o increased pain and a firm area to R breast.  Reports that she spoke with a doctor about it already and cultured the discharge.  She doesn't have those results with her.  Reports feeling cold but denies fever.

## 2015-08-30 ENCOUNTER — Encounter (HOSPITAL_COMMUNITY): Payer: Self-pay | Admitting: *Deleted

## 2015-08-30 ENCOUNTER — Emergency Department (HOSPITAL_COMMUNITY)
Admission: EM | Admit: 2015-08-30 | Discharge: 2015-08-30 | Disposition: A | Discharge status: Home / Self Care | Payer: Medicaid Other | Attending: Physician Assistant | Admitting: Physician Assistant

## 2015-08-30 ENCOUNTER — Emergency Department (HOSPITAL_COMMUNITY): Payer: Medicaid Other

## 2015-08-30 ENCOUNTER — Emergency Department (HOSPITAL_COMMUNITY)
Admission: EM | Admit: 2015-08-30 | Discharge: 2015-08-30 | Disposition: A | Discharge status: Home / Self Care | Payer: Medicaid Other | Source: Home / Self Care

## 2015-08-30 DIAGNOSIS — T814XXA Infection following a procedure, initial encounter: Secondary | ICD-10-CM | POA: Insufficient documentation

## 2015-08-30 DIAGNOSIS — T148XXA Other injury of unspecified body region, initial encounter: Secondary | ICD-10-CM

## 2015-08-30 DIAGNOSIS — Z79899 Other long term (current) drug therapy: Secondary | ICD-10-CM | POA: Insufficient documentation

## 2015-08-30 DIAGNOSIS — IMO0002 Reserved for concepts with insufficient information to code with codable children: Secondary | ICD-10-CM

## 2015-08-30 DIAGNOSIS — Y828 Other medical devices associated with adverse incidents: Secondary | ICD-10-CM | POA: Insufficient documentation

## 2015-08-30 NOTE — ED Provider Notes (Signed)
[facility]ENCY DEPT Provider Note   CSN: [id number] Arrival date & time: 08/30/15  0128  By signing my name below, I, Arianna Nassar, attest that this documentation has been prepared under the direction and in the presence of Jakeel Starliper Randall An, MD.  Electronically Signed: Octavia Heir, ED Scribe. 08/30/15. 2:54 AM.    History   Chief Complaint Chief Complaint  Patient presents with  . Abdominal Pain     The history is provided by the patient. No language interpreter was used.   HPI Comments: Penny Walker is a 40 y.o. female who presents to the Emergency Department presenting with multiple complaints. Pt had a breast lift in [place] ~ 3 weeks ago. She notes having increased pain, discharge from incision site, and a lump in her right breast. She says she feels fatigued, weak, and has had chills. Pt also complains of vomiting after each meal for ~ 3 weeks since her surgery. Pt denies cough or fever.  Past Medical History:  Diagnosis Date  . Thyroid disease     There are no active problems to display for this patient.   Past Surgical History:  Procedure Laterality Date  . CESAREAN SECTION    . LAPAROSCOPIC GASTRIC SLEEVE RESECTION    . THYROIDECTOMY    . TONSILLECTOMY      OB History    Gravida Para Term Preterm AB Living   3 3 3     3    SAB TAB Ectopic Multiple Live Births                   Home Medications    Prior to Admission medications   Medication Sig Start Date End Date Taking? Authorizing Provider  amoxicillin-clavulanate (AUGMENTIN) 875-125 MG per tablet Take 1 tablet by mouth 2 (two) times daily. Patient not taking: Reported on 09/19/2014 07/05/12   Naida Sleight, PA-C  diclofenac (VOLTAREN) 50 MG EC tablet Take 1 tablet (50 mg total) by mouth 2 (two) times daily as needed. Patient not taking: Reported on 09/19/2014 09/13/13   Rodolph Bong, MD  fexofenadine-pseudoephedrine (ALLEGRA-D) 60-120 MG per tablet Take 1 tablet by mouth every 12 (twelve)  hours. Patient not taking: Reported on 09/19/2014 07/05/12   Naida Sleight, PA-C  fluticasone [facility]) 50 MCG/ACT nasal spray Place 2 sprays into the nose daily. For allergies Patient not taking: Reported on 09/19/2014 06/04/12   Hayden Rasmussen, NP  meloxicam (MOBIC) 7.5 MG tablet Take 1 tablet (7.5 mg total) by mouth daily. Patient not taking: Reported on 09/19/2014 11/25/11   Jimmie Molly, MD  oxybutynin (DITROPAN) 5 MG tablet Take 1 tablet (5 mg total) by mouth 3 (three) times daily. Patient not taking: Reported on 08/30/2015 05/27/11 08/30/15  Reuben Likes, MD  penicillin v potassium (VEETID) 500 MG tablet Take 1 tablet (500 mg total) by mouth 3 (three) times daily. Patient not taking: Reported on 09/19/2014 10/28/11   Johnsie Kindred, NP  Phenylephrine-Chlorphen-DM 10-08-10.5 MG/5ML LIQD Take 5 mLs by mouth every 4 (four) hours as needed. Patient not taking: Reported on 09/19/2014 06/04/12   Hayden Rasmussen, NP  traMADol (ULTRAM) 50 MG tablet Take 1 tablet (50 mg total) by mouth every 6 (six) hours as needed for pain. Patient not taking: Reported on 09/19/2014 10/28/11   Johnsie Kindred, NP    Family History Family History  Problem Relation Age of Onset  . Diabetes Other   . Hypertension Other   . Hypertension Mother   . Stroke Mother   .  Diabetes Father   . Hypertension Father   . Stroke Father     Social History Social History  Substance Use Topics  . Smoking status: Never Smoker  . Smokeless tobacco: Never Used  . Alcohol use No     Comment: OCASSIONALLY     Allergies   Review of patient's allergies indicates no known allergies.   Review of Systems Review of Systems  Constitutional: Positive for chills. Negative for fever.  Respiratory: Negative for cough.   Skin: Positive for wound (wound check to bilateral breasts).  All other systems reviewed and are negative.    Physical Exam Updated Vital Signs BP 100/73   Pulse 66   Temp 98.2 F (36.8 C) (Oral)   Resp 20   Ht 5\' 8"   (1.727 m)   Wt 184 lb (83.5 kg)   LMP 08/27/2015   SpO2 99%   BMI 27.98 kg/m   Physical Exam  Constitutional: She is oriented to person, place, and time. She appears well-developed and well-nourished.  HENT:  Head: Normocephalic.  Eyes: EOM are normal.  Neck: Normal range of motion.  Pulmonary/Chest: Effort normal.  Abdominal: She exhibits no distension.  Musculoskeletal: Normal range of motion. She exhibits tenderness.  Bilateral breast lift that is clean and dry Trace erythema consistent with healing surgical wounds Bilateral induration of breasts with R>L Binder over abdominal plasty scar  Neurological: She is alert and oriented to person, place, and time.  Psychiatric: She has a normal mood and affect.  Nursing note and vitals reviewed.    ED Treatments / Results  DIAGNOSTIC STUDIES: Oxygen Saturation is 100% on RA, normal by my interpretation.  COORDINATION OF CARE:  2:51 AM Discussed treatment plan with pt at bedside and pt agreed to plan.  Labs (all labs ordered are listed, but only abnormal results are displayed) Labs Reviewed - No data to display  EKG  EKG Interpretation None       Radiology US Breast Ltd Uni Right Inc Axilla  Result Date: 08/30/2015 CLINICAL DATA:  Right breast pain and swelling for 1 week, postop breast surgery 3 weeks prior. EXAM: ULTRASOUND OF THE RIGHT BREAST COMPARISON:  None. FINDINGS: No physical exam was performed due to the emergent nature. Targeted ultrasound is performed targeting the area of clinical concern at the 6 o'clock position in the region of scar, showing ill-defined fluid measuring 4.3 x 1.3 x 4.8 cm. Margins are indistinct common no thick wall or surrounding hyperemia. There is edema in the adjacent breast tissue. IMPRESSION: Indistinct fluid in the right breast in the regional clinical concern without organized or well-defined collection/abscess. RECOMMENDATION: Recommend short interval follow-up with diagnostic breast  imaging at the breast center as soon as possible. BI-RADS CATEGORY  3: Probably benign finding(s) - short interval follow-up suggested. Electronically Signed   By: Rubye Oaks M.D.   On: 08/30/2015 05:49    Procedures Procedures (including critical care time)  Medications Ordered in ED Medications - No data to display   Initial Impression / Assessment and Plan / ED Course  I have reviewed the triage vital signs and the nursing notes.  Pertinent labs & imaging results that were available during my care of the patient were reviewed by me and considered in my medical decision making (see chart for details).  Clinical Course   Patient is a very pleasant 40 year old female who had recent surgery in [place] : both an abdominoplasty and bilateral breast augmentation/left. Patient was concerned because she was feeling a  little bit more fatigued and felt like there was a little bit more induration in her right breast. There is no evidence of infection. Ultrasound shows no evidence of fluid collection to be drained. We'll have her follow-up with the breast center/her primary care physician or surgeon that she is planning to follow up with here in the [place].  She had normal labs completed last night. Vital signs are stable and physical exam is otherwise reassuring.  Patient is comfortable, ambulatory, and taking PO at time of discharge.  Patient expressed understanding about return precautions.    Final Clinical Impressions(s) / ED Diagnoses   Final diagnoses:  Wound abscess    New Prescriptions New Prescriptions   No medications on file   I personally performed the services described in this documentation, which was scribed in my presence. The recorded information has been reviewed and is accurate.      Izsak Meir Randall AnLyn Emile Ringgenberg, MD 08/30/15 [phone]

## 2015-08-30 NOTE — ED Notes (Signed)
Patient transported to Ultrasound 

## 2015-08-30 NOTE — ED Notes (Signed)
No answer, unable to find. 

## 2015-08-30 NOTE — ED Triage Notes (Addendum)
Here earlier, went out to car and fell asleep. See previous triage chart and lab results. Here for abd pain, onset 3 weeks ago, also reports nv, also mentions some dizziness and R breast incisional discharge from surgery done in [place] 4 weeks ago, mentions hardness under incision. (denies: fever or diarrhea), h/o gastric sleeve 3 years ago. Alert, NAD, calm, interactive, speech clear, resps e/u, steady gait. No dyspnea noted.   States, abd pain earlier was a 9/10, denies abd pain at this time, but reports breast pain is 7/10 and is itchy.  (Copied[facility]  [facility]) C/o mid upper abd pain with nausea and vomiting that starts approx 10 min after eating and last for 3 hours.  States symptoms started approx 3 weeks ago.  Reports having an endoscopy 3 weeks ago in [place] for same.  Also reports she had R breast surgery 3 weeks ago in [place] and took 3 different antibiotics after surgery.  States she is now having discharge from suture site and c/o increased pain and a firm area to R breast.  Reports that she spoke with a doctor about it already and cultured the discharge.  She doesn't have those results with her.  Reports feeling cold but denies fever.

## 2015-08-30 NOTE — ED Notes (Signed)
Patient stated at 22:30 that she was stepping outside. Tried to call for pt in waiting room, outside and on cell phone with no answer to any.

## 2015-08-30 NOTE — Discharge Instructions (Signed)
I do not see any evidence of infection at this time. Please continue to monitor for any redness or concerns, return. Please follow-up with her primary care physician.

## 2016-10-29 ENCOUNTER — Other Ambulatory Visit: Payer: Self-pay | Admitting: *Deleted

## 2016-10-29 DIAGNOSIS — N6452 Nipple discharge: Secondary | ICD-10-CM

## 2016-10-29 DIAGNOSIS — N63 Unspecified lump in unspecified breast: Secondary | ICD-10-CM

## 2016-11-05 ENCOUNTER — Ambulatory Visit: Payer: No Typology Code available for payment source

## 2016-11-05 ENCOUNTER — Ambulatory Visit
Admission: RE | Admit: 2016-11-05 | Discharge: 2016-11-05 | Disposition: A | Discharge status: Home / Self Care | Payer: BLUE CROSS/BLUE SHIELD | Source: Ambulatory Visit | Attending: *Deleted | Admitting: *Deleted

## 2016-11-05 DIAGNOSIS — N6452 Nipple discharge: Secondary | ICD-10-CM

## 2016-11-05 DIAGNOSIS — N63 Unspecified lump in unspecified breast: Secondary | ICD-10-CM

## 2017-09-28 ENCOUNTER — Other Ambulatory Visit: Payer: Self-pay | Admitting: Family Medicine

## 2017-09-28 DIAGNOSIS — Z1231 Encounter for screening mammogram for malignant neoplasm of breast: Secondary | ICD-10-CM

## 2017-11-07 ENCOUNTER — Ambulatory Visit: Payer: BLUE CROSS/BLUE SHIELD

## 2018-02-20 ENCOUNTER — Other Ambulatory Visit: Payer: Self-pay | Admitting: Family Medicine

## 2018-02-20 DIAGNOSIS — Z1231 Encounter for screening mammogram for malignant neoplasm of breast: Secondary | ICD-10-CM

## 2018-03-13 ENCOUNTER — Ambulatory Visit
Admission: RE | Admit: 2018-03-13 | Discharge: 2018-03-13 | Disposition: A | Discharge status: Home / Self Care | Payer: Medicaid Other | Source: Ambulatory Visit | Attending: Family Medicine | Admitting: Family Medicine

## 2018-03-13 DIAGNOSIS — Z1231 Encounter for screening mammogram for malignant neoplasm of breast: Secondary | ICD-10-CM

## 2018-07-06 IMAGING — US US BREAST*R* LIMITED INC AXILLA
1 series · 14 of 16 positions shown · non-contrast
Comparison: None.

CLINICAL DATA: Right breast pain and swelling for 1 week, postop
breast surgery 3 weeks prior.

EXAM:
ULTRASOUND OF THE RIGHT BREAST

[Series 1: us breast*right* limited inc axilla · 0.08mm/px · 14 of 16 slices shown]
[im 1/16]
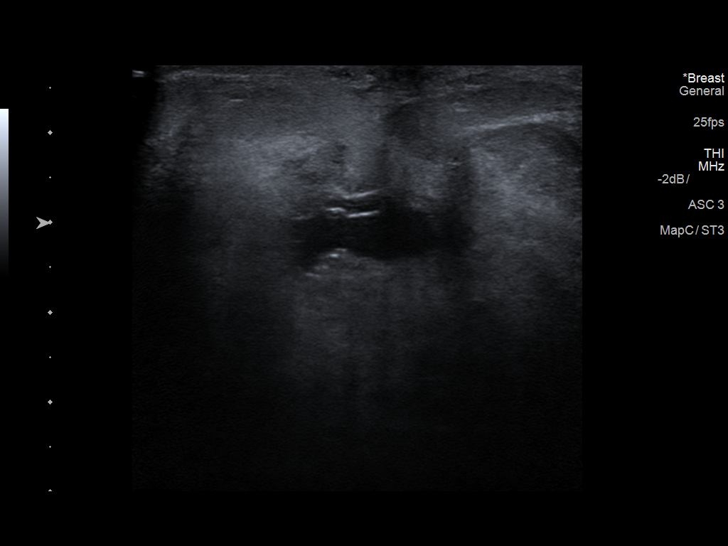
[im 2/16]
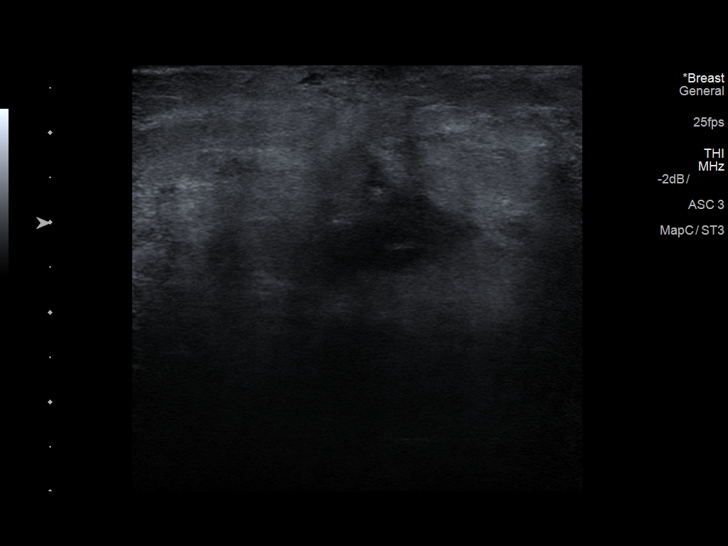
[im 3/16]
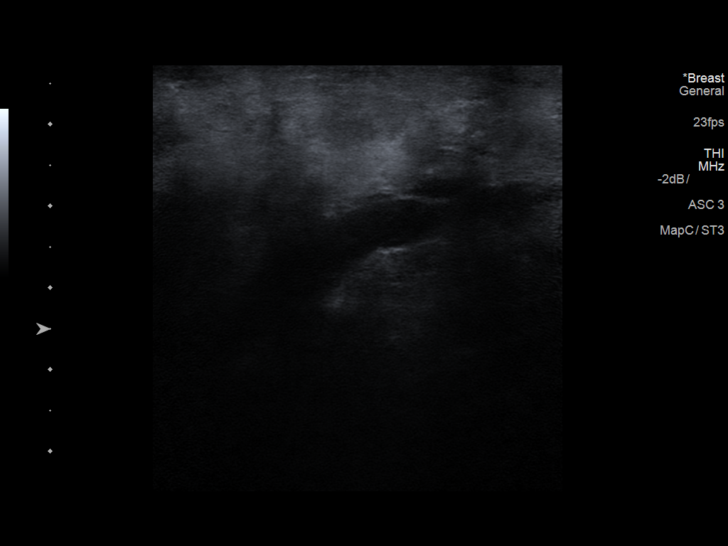
[im 5/16]
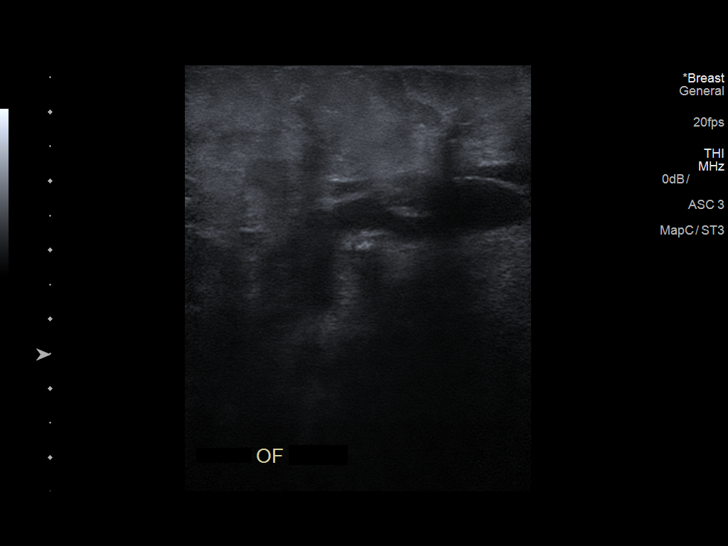
[im 6/16]
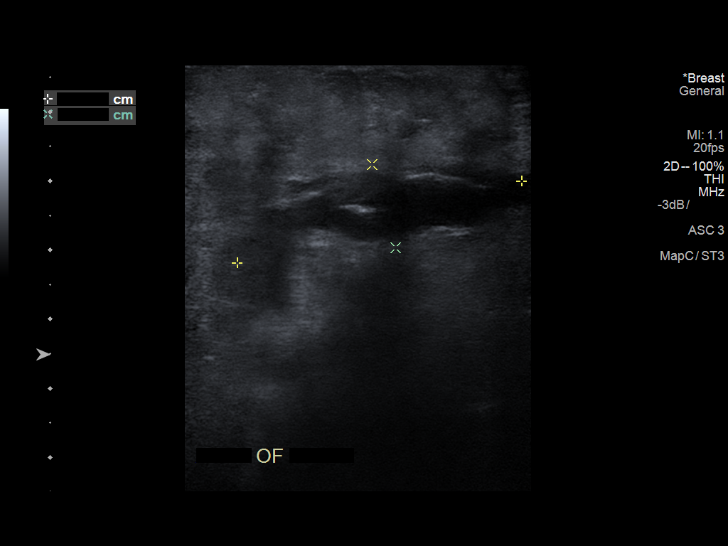
[im 7/16]
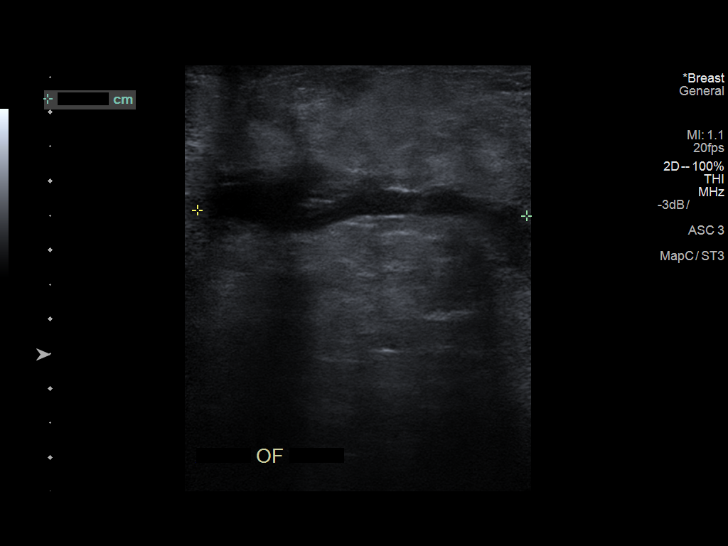
[im 8/16]
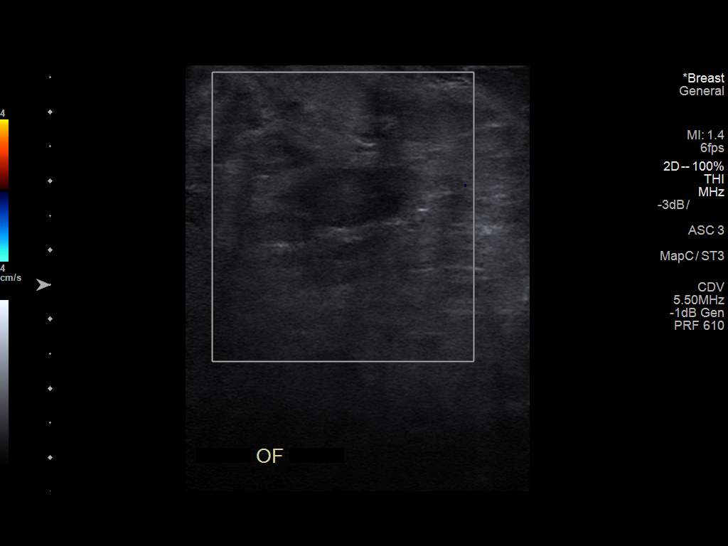
[im 9/16]
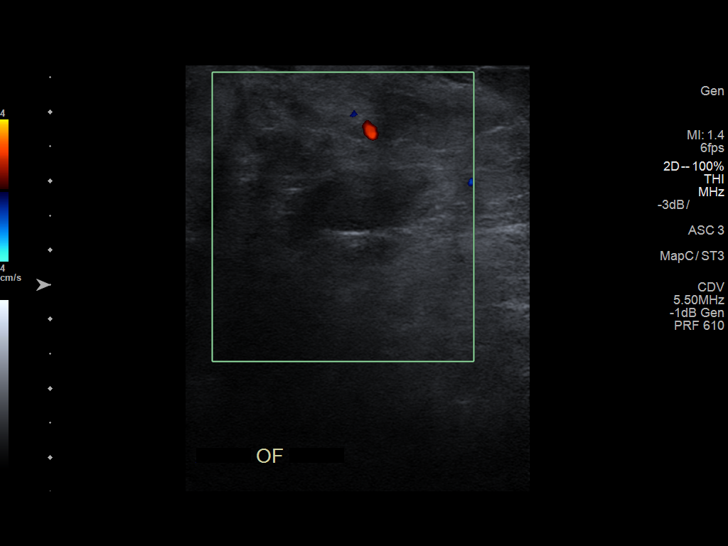
[im 10/16]
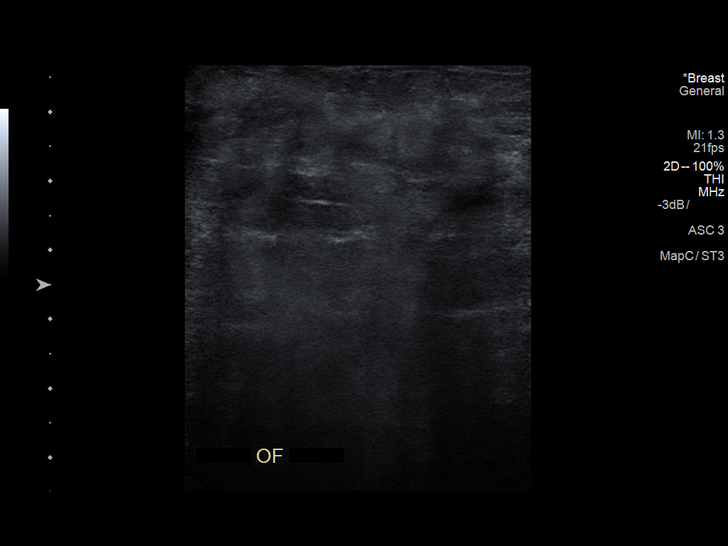
[im 11/16]
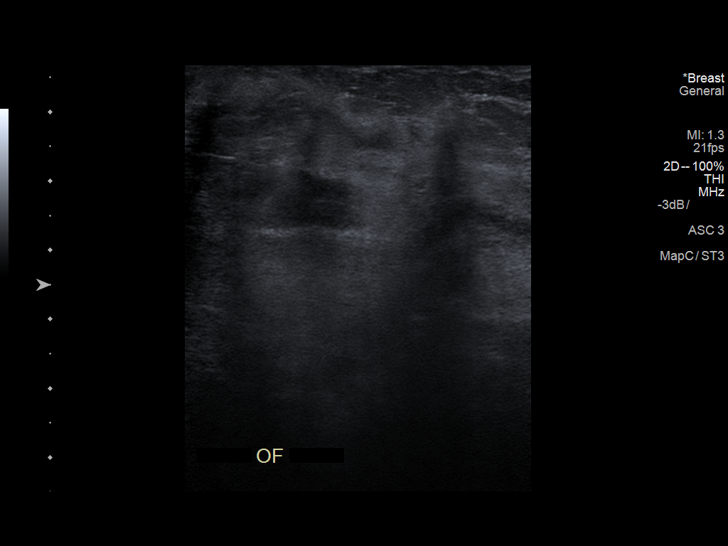
[im 13/16]
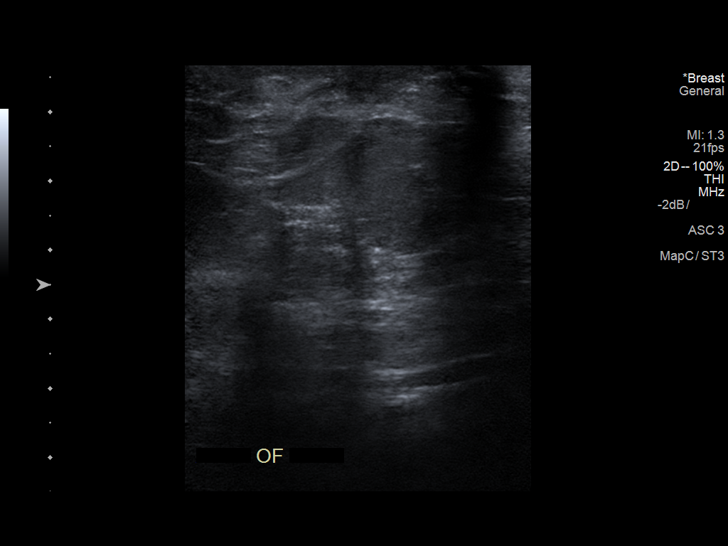
[im 14/16]
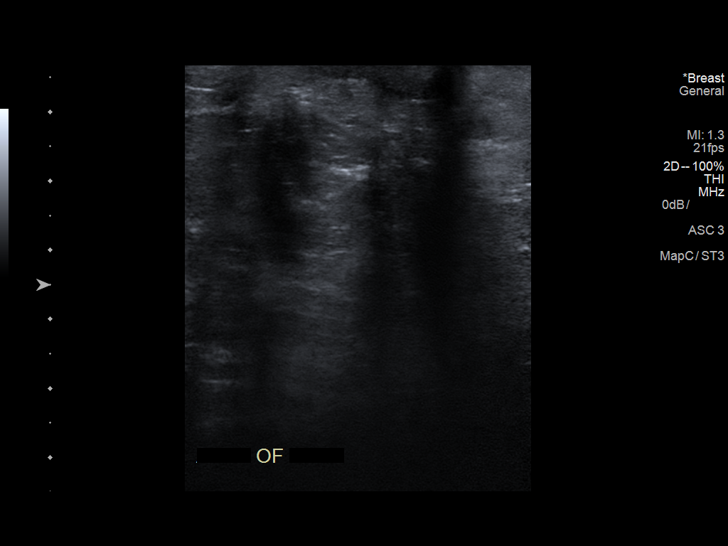
[im 15/16]
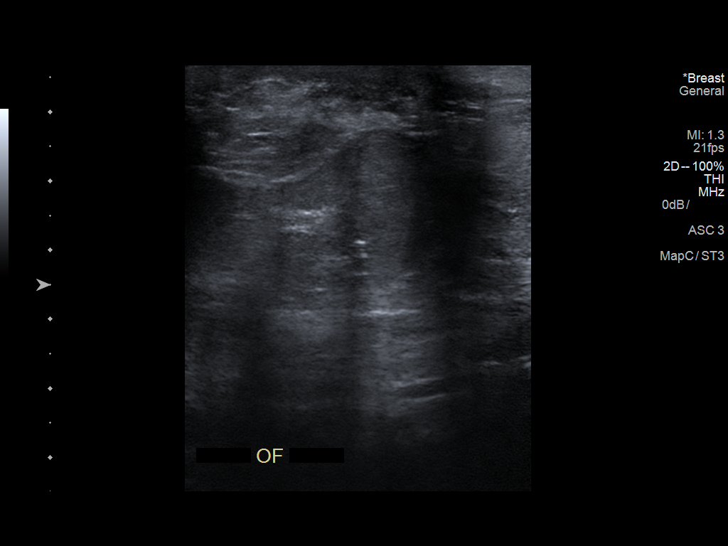
[im 16/16]
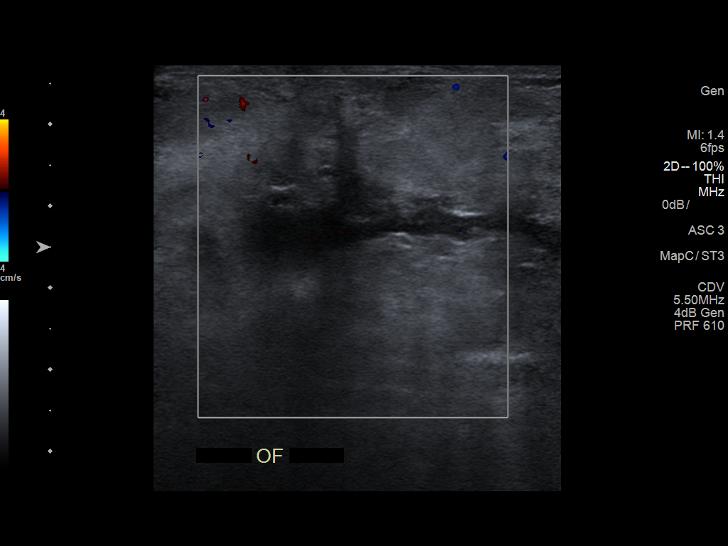

[14 of 16 positions shown; findings below may reference images not displayed]

FINDINGS: No physical exam was performed due to the emergent nature.

Targeted ultrasound is performed targeting the area of clinical
concern at the 6 o'clock position in the region of scar, showing
ill-defined fluid measuring 4.3 x 1.3 x 4.8 cm. Margins are
indistinct common no thick wall or surrounding hyperemia. There is
edema in the adjacent breast tissue.
IMPRESSION: Indistinct fluid in the right breast in the regional clinical
concern without organized or well-defined collection/abscess.

RECOMMENDATION:
Recommend short interval follow-up with diagnostic breast [HOSPITAL]
the [REDACTED] as soon as possible.

BI-RADS CATEGORY  3: Probably benign finding(s) - short interval
follow-up suggested.

## 2019-04-17 ENCOUNTER — Ambulatory Visit: Payer: Medicaid Other | Attending: Nurse Practitioner | Admitting: Physical Therapy

## 2019-09-20 ENCOUNTER — Other Ambulatory Visit: Payer: Self-pay | Admitting: Physician Assistant

## 2019-09-20 DIAGNOSIS — H9201 Otalgia, right ear: Secondary | ICD-10-CM

## 2019-10-03 ENCOUNTER — Ambulatory Visit
Admission: RE | Admit: 2019-10-03 | Discharge: 2019-10-03 | Disposition: A | Discharge status: Home / Self Care | Payer: Medicaid Other | Source: Ambulatory Visit | Attending: Physician Assistant | Admitting: Physician Assistant

## 2019-10-03 DIAGNOSIS — G8929 Other chronic pain: Secondary | ICD-10-CM

## 2019-10-03 MED ORDER — IOPAMIDOL (ISOVUE-300) INJECTION 61%
75.0000 mL | Freq: Once | INTRAVENOUS | Status: AC | PRN
  Administered 2019-10-03: 75 mL via INTRAVENOUS

## 2019-11-13 ENCOUNTER — Other Ambulatory Visit: Payer: Self-pay | Admitting: Otolaryngology

## 2019-11-27 ENCOUNTER — Other Ambulatory Visit: Payer: Self-pay | Admitting: Family Medicine

## 2019-11-27 ENCOUNTER — Other Ambulatory Visit: Payer: Self-pay | Admitting: *Deleted

## 2019-11-27 DIAGNOSIS — R5381 Other malaise: Secondary | ICD-10-CM

## 2019-11-28 ENCOUNTER — Other Ambulatory Visit: Payer: Self-pay | Admitting: Nurse Practitioner

## 2019-11-28 DIAGNOSIS — Z1231 Encounter for screening mammogram for malignant neoplasm of breast: Secondary | ICD-10-CM

## 2019-12-05 ENCOUNTER — Other Ambulatory Visit: Payer: Self-pay | Admitting: Otolaryngology

## 2019-12-05 DIAGNOSIS — G8929 Other chronic pain: Secondary | ICD-10-CM

## 2019-12-05 DIAGNOSIS — E041 Nontoxic single thyroid nodule: Secondary | ICD-10-CM

## 2019-12-05 DIAGNOSIS — H9201 Otalgia, right ear: Secondary | ICD-10-CM

## 2019-12-05 DIAGNOSIS — H93293 Other abnormal auditory perceptions, bilateral: Secondary | ICD-10-CM

## 2019-12-18 ENCOUNTER — Other Ambulatory Visit: Payer: Medicaid Other

## 2019-12-18 ENCOUNTER — Ambulatory Visit
Admission: RE | Admit: 2019-12-18 | Discharge: 2019-12-18 | Disposition: A | Discharge status: Home / Self Care | Payer: Medicaid Other | Source: Ambulatory Visit | Attending: Otolaryngology | Admitting: Otolaryngology

## 2019-12-18 DIAGNOSIS — H93293 Other abnormal auditory perceptions, bilateral: Secondary | ICD-10-CM

## 2019-12-18 DIAGNOSIS — G8929 Other chronic pain: Secondary | ICD-10-CM

## 2019-12-18 DIAGNOSIS — H9201 Otalgia, right ear: Secondary | ICD-10-CM

## 2019-12-18 DIAGNOSIS — E041 Nontoxic single thyroid nodule: Secondary | ICD-10-CM

## 2020-01-14 ENCOUNTER — Ambulatory Visit: Payer: Medicaid Other

## 2020-02-04 ENCOUNTER — Other Ambulatory Visit: Payer: Self-pay | Admitting: Nurse Practitioner

## 2020-02-04 DIAGNOSIS — Z1231 Encounter for screening mammogram for malignant neoplasm of breast: Secondary | ICD-10-CM

## 2020-02-05 ENCOUNTER — Ambulatory Visit
Admission: RE | Admit: 2020-02-05 | Discharge: 2020-02-05 | Disposition: A | Discharge status: Home / Self Care | Payer: Medicaid Other | Source: Ambulatory Visit | Attending: Nurse Practitioner | Admitting: Nurse Practitioner

## 2020-02-05 ENCOUNTER — Other Ambulatory Visit: Payer: Self-pay

## 2020-02-05 ENCOUNTER — Ambulatory Visit: Payer: Medicaid Other

## 2020-02-05 DIAGNOSIS — Z1231 Encounter for screening mammogram for malignant neoplasm of breast: Secondary | ICD-10-CM

## 2020-11-14 ENCOUNTER — Other Ambulatory Visit: Payer: Self-pay | Admitting: Otolaryngology

## 2020-11-14 DIAGNOSIS — E041 Nontoxic single thyroid nodule: Secondary | ICD-10-CM

## 2020-12-05 ENCOUNTER — Other Ambulatory Visit: Payer: Medicaid Other

## 2020-12-23 ENCOUNTER — Other Ambulatory Visit: Payer: Self-pay

## 2020-12-23 DIAGNOSIS — I872 Venous insufficiency (chronic) (peripheral): Secondary | ICD-10-CM

## 2021-01-09 ENCOUNTER — Ambulatory Visit (HOSPITAL_COMMUNITY): Payer: Medicaid Other

## 2021-01-09 ENCOUNTER — Encounter: Payer: Medicaid Other | Admitting: Vascular Surgery

## 2021-06-09 ENCOUNTER — Other Ambulatory Visit: Payer: Self-pay | Admitting: Endocrinology

## 2021-06-09 ENCOUNTER — Other Ambulatory Visit: Payer: Self-pay | Admitting: Internal Medicine

## 2021-06-09 DIAGNOSIS — Z1231 Encounter for screening mammogram for malignant neoplasm of breast: Secondary | ICD-10-CM

## 2021-06-12 ENCOUNTER — Ambulatory Visit
Admission: RE | Admit: 2021-06-12 | Discharge: 2021-06-12 | Disposition: A | Discharge status: Home / Self Care | Payer: Medicaid Other | Source: Ambulatory Visit | Attending: Endocrinology | Admitting: Endocrinology

## 2021-06-12 DIAGNOSIS — Z1231 Encounter for screening mammogram for malignant neoplasm of breast: Secondary | ICD-10-CM

## 2021-09-29 ENCOUNTER — Other Ambulatory Visit: Payer: Self-pay | Admitting: *Deleted

## 2021-09-29 DIAGNOSIS — I872 Venous insufficiency (chronic) (peripheral): Secondary | ICD-10-CM

## 2021-10-09 ENCOUNTER — Ambulatory Visit (HOSPITAL_COMMUNITY)
Admission: RE | Admit: 2021-10-09 | Discharge: 2021-10-09 | Disposition: A | Discharge status: Home / Self Care | Payer: BC Managed Care – PPO | Source: Ambulatory Visit | Attending: Surgery | Admitting: Surgery

## 2021-10-09 ENCOUNTER — Encounter: Payer: Self-pay | Admitting: Physician Assistant

## 2021-10-09 ENCOUNTER — Ambulatory Visit (INDEPENDENT_AMBULATORY_CARE_PROVIDER_SITE_OTHER): Payer: BC Managed Care – PPO | Admitting: Physician Assistant

## 2021-10-09 VITALS — BP 135/93 | HR 73 | Temp 97.8°F | Resp 16 | Ht 68.0 in | Wt 203.0 lb

## 2021-10-09 DIAGNOSIS — I872 Venous insufficiency (chronic) (peripheral): Secondary | ICD-10-CM | POA: Insufficient documentation

## 2021-10-09 DIAGNOSIS — M7989 Other specified soft tissue disorders: Secondary | ICD-10-CM

## 2021-10-09 NOTE — Progress Notes (Addendum)
Requested by:  Penny Pacini, FNP [address] [place],  [place] [address]  Reason for consultation: pain in legs    History of Present Illness   Penny Walker is a 46 y.o. (17-Aug-1975) female who presents for evaluation of pain in bilateral lower extremities from knees down into feet. Right somewhat worse than left. She says this has been going on for over 1 year. She describes the pain as sharp shooting pains that last for several hours. The occur from knee down into foot. She also does have pain in her knees that she attributes to Arthritis. Says it feels sometimes like something is "stabbing through my bones". This is worse on standing and also prolonged sitting, such as when traveling in a car. It also occurs at night and wakes her up from sleep. No real alleviating factors. She elevates almost daily with minimal relief. She has tried compression stockings but they made her legs hurt more so she stopped wearing them. She says she takes Ibuprofen but it does not help. She denies any aching, heaviness, tiredness, burning, itching, swelling or skin changes. She has no history of DVT. No family history of DVT or venous disease.   Venous symptoms include: Pain Onset/duration:  > 1 year  Occupation:  Runner, broadcasting/film/video Aggravating factors: sitting, standing Alleviating factors: elevation Compression:  yes Helps:  no Pain medications:  Ibuprofen Previous vein procedures:  no History of DVT:  no  Past Medical History:  Diagnosis Date   Thyroid disease     Past Surgical History:  Procedure Laterality Date   CESAREAN SECTION     LAPAROSCOPIC GASTRIC SLEEVE RESECTION     REDUCTION MAMMAPLASTY Bilateral 07/2015   THYROIDECTOMY     TONSILLECTOMY      Social History   Socioeconomic History   Marital status: Married    Spouse name: Not on file   Number of children: Not on file   Years of education: Not on file   Highest education level: Not on file  Occupational History   Not on file   Tobacco Use   Smoking status: Never   Smokeless tobacco: Never  Substance and Sexual Activity   Alcohol use: No    Comment: OCASSIONALLY   Drug use: Not on file   Sexual activity: Yes    Birth control/protection: I.U.D.  Other Topics Concern   Not on file  Social History Narrative   Not on file   Social Determinants of Health   Financial Resource Strain: Not on file  Food Insecurity: Not on file  Transportation Needs: Not on file  Physical Activity: Not on file  Stress: Not on file  Social Connections: Not on file  Intimate Partner Violence: Not on file   Family History  Problem Relation Age of Onset   Diabetes Other    Hypertension Other    Hypertension Mother    Stroke Mother    Diabetes Father    Hypertension Father    Stroke Father     Current Outpatient Medications  Medication Sig Dispense Refill   levothyroxine (SYNTHROID) 100 MCG tablet Take 1 tablet by mouth every morning.     Vitamin D, Ergocalciferol, (DRISDOL) 1.25 MG (50000 UNIT) CAPS capsule Take 50,000 Units by mouth once a week.     No current facility-administered medications for this visit.    No Known Allergies  REVIEW OF SYSTEMS (negative unless checked):   Cardiac:  []  Chest pain or chest pressure? []  Shortness of breath upon  activity? []  Shortness of breath when lying flat? []  Irregular heart rhythm?  Vascular:  [x]  Pain in calf, thigh, or hip brought on by walking? [x]  Pain in feet at night that wakes you up from your sleep? []  Blood clot in your veins? []  Leg swelling?  Pulmonary:  []  Oxygen at home? []  Productive cough? []  Wheezing?  Neurologic:  []  Sudden weakness in arms or legs? []  Sudden numbness in arms or legs? []  Sudden onset of difficult speaking or slurred speech? []  Temporary loss of vision in one eye? []  Problems with dizziness?  Gastrointestinal:  []  Blood in stool? []  Vomited blood?  Genitourinary:  []  Burning when urinating? []  Blood in  urine?  Psychiatric:  []  Major depression  Hematologic:  []  Bleeding problems? []  Problems with blood clotting?  Dermatologic:  []  Rashes or ulcers?  Constitutional:  []  Fever or chills?  Ear/Nose/Throat:  []  Change in hearing? []  Nose bleeds? []  Sore throat?  Musculoskeletal:  []  Back pain? []  Joint pain? []  Muscle pain?   Physical Examination     Vitals:   10/09/21 1408  BP: (!) 135/93  Pulse: 73  Resp: 16  Temp: 97.8 F (36.6 C)  TempSrc: Temporal  SpO2: 99%  Weight: 203 lb (92.1 kg)  Height: 5\' 8"  (1.727 m)   Body mass index is 30.87 kg/m.  General:  WDWN in NAD; vital signs documented above Gait: Normal HENT: WNL, normocephalic Pulmonary: normal non-labored breathing Cardiac: regular HR Vascular Exam/Pulses:  Right Left  Radial 2+ (normal) 2+ (normal)  Femoral 2+ (normal) 2+ (normal)  Popliteal 2+ (normal) 2+ (normal)  DP 2+ (normal) 2+ (normal)  PT 2+ (normal) 2+ (normal)   Extremities: with varicose veins of bilateral lower legs, with reticular veins and spider veins on both thighs and lower legs, without edema, without stasis pigmentation, without lipodermatosclerosis, without ulcers Musculoskeletal: no muscle wasting or atrophy  Neurologic: A&O X 3;  No focal weakness or paresthesias are detected Psychiatric:  The pt has Normal affect.  Non-invasive Vascular Imaging   BLE Venous Insufficiency Duplex (10/09/21):  RLE:  No DVT and SVT GSV reflux mid thigh GSV diameter 0.24 cm-0.32 cm No SSV reflux  No deep venous reflux  Medical Decision Making   Penny Walker is a 46 y.o. female who presents with: RLE chronic venous insufficiency with pain. Her duplex today shows no DVT or SVT. She has very minimal superficial venous reflux in the mid thigh. No deep reflux. Her greater saphenous is very small. Based on today's duplex she is not a candidate for venous ablation. Her findings on duplex do not really explain patients symptoms and her  symptoms are not typical for venous insufficiency. I discussed this with her and recommend she follow up with PCP for further evaluation but I suspect this is more musculoskeletal vs neuropathic pain.  Based on the patient's history and examination, I recommend: continue daily elevation, knee high compression stockings 15-20 mmHg, exercise therapy, refrain from prolonged sitting or standing She was measured and fitted for knee high compression stockings today Advised her to follow up with PCP regarding further evaluation of her pain. Suspect she is having some neuropathic pain She can follow up as needed if she has new or worsening symptoms   Karoline Caldwell, PA-C [facility]: [phone]  10/09/2021, 2:40 PM  On call MD: Virl Cagey

## 2022-08-03 ENCOUNTER — Other Ambulatory Visit: Payer: Self-pay | Admitting: Internal Medicine

## 2022-08-03 DIAGNOSIS — Z1231 Encounter for screening mammogram for malignant neoplasm of breast: Secondary | ICD-10-CM

## 2022-08-11 ENCOUNTER — Ambulatory Visit
Admission: RE | Admit: 2022-08-11 | Discharge: 2022-08-11 | Disposition: A | Discharge status: Home / Self Care | Payer: BC Managed Care – PPO | Source: Ambulatory Visit | Attending: Internal Medicine | Admitting: Internal Medicine

## 2022-08-11 DIAGNOSIS — Z1231 Encounter for screening mammogram for malignant neoplasm of breast: Secondary | ICD-10-CM

## 2023-04-24 ENCOUNTER — Ambulatory Visit
Admission: RE | Admit: 2023-04-24 | Discharge: 2023-04-24 | Disposition: A | Discharge status: Home / Self Care | Source: Ambulatory Visit | Attending: Emergency Medicine | Admitting: Emergency Medicine

## 2023-04-24 ENCOUNTER — Other Ambulatory Visit: Payer: Self-pay

## 2023-04-24 VITALS — BP 144/95 | HR 73 | Temp 99.0°F | Resp 16

## 2023-04-24 DIAGNOSIS — H00022 Hordeolum internum right lower eyelid: Secondary | ICD-10-CM | POA: Diagnosis not present

## 2023-04-24 MED ORDER — ERYTHROMYCIN 5 MG/GM OP OINT: TOPICAL_OINTMENT | OPHTHALMIC | 0 refills | Status: AC

## 2023-04-24 NOTE — ED Triage Notes (Signed)
 Pt presents to [facility] with co eye pain and irritation for 3 days. Pt has attempted an eye ointment and warm compresses.

## 2023-04-24 NOTE — ED Provider Notes (Signed)
 [facility]    CSN: [id number] Arrival date & time: 04/24/23  1250      History   Chief Complaint Chief Complaint  Patient presents with   Facial Swelling    HPI Penny Walker is a 48 y.o. female.  3-day history of bump on the left lower eyelid.  Started to become more tender today, increased swelling.  She was concerned because this morning she noticed pus.  Not having any irritation, redness, discharge from the eye itself. No vision changes.  No fevers Has [place] from [place] Does not wear contacts   Past Medical History:  Diagnosis Date   Thyroid  disease     There are no active problems to display for this patient.   Past Surgical History:  Procedure Laterality Date   CESAREAN SECTION     LAPAROSCOPIC GASTRIC SLEEVE RESECTION     REDUCTION MAMMAPLASTY Bilateral 07/2015   THYROIDECTOMY     TONSILLECTOMY      OB History     Gravida  3   Para  3   Term  3   Preterm      AB      Living  3      SAB      IAB      Ectopic      Multiple      Live Births               Home Medications    Prior to Admission medications   Medication Sig Start Date End Date Taking? Authorizing Provider  erythromycin  ophthalmic ointment Place a 1/2 inch ribbon of ointment into the lower eyelid twice daily for 5-6 days 04/24/23  Yes Hanh Kertesz, Ivette Marks, PA-C  levothyroxine (SYNTHROID) 100 MCG tablet Take 1 tablet by mouth every morning. 06/04/19   [provider]  Vitamin D, Ergocalciferol, (DRISDOL) 1.25 MG (50000 UNIT) CAPS capsule Take 50,000 Units by mouth once a week. 06/24/21   [provider]    Family History Family History  Problem Relation Age of Onset   Diabetes Other    Hypertension Other    Hypertension Mother    Stroke Mother    Diabetes Father    Hypertension Father    Stroke Father     Social History Social History   Tobacco Use   Smoking status: Never   Smokeless tobacco: Never  Substance  Use Topics   Alcohol use: No    Comment: OCASSIONALLY     Allergies   Patient has no known allergies.   Review of Systems Review of Systems Per HPI  Physical Exam Triage Vital Signs ED Triage Vitals  Encounter Vitals Group     BP 04/24/23 1306 (!) 144/95     Systolic BP Percentile --      Diastolic BP Percentile --      Pulse Rate 04/24/23 1306 73     Resp 04/24/23 1306 16     Temp 04/24/23 1306 99 F (37.2 C)     Temp src --      SpO2 04/24/23 1306 98 %     Weight --      Height --      Head Circumference --      Peak Flow --      Pain Score 04/24/23 1304 6     Pain Loc --      Pain Education --      Exclude from Growth Chart --  No data found.  Updated Vital Signs BP (!) 144/95   Pulse 73   Temp 99 F (37.2 C)   Resp 16   SpO2 98%    Physical Exam Vitals and nursing note reviewed.  Constitutional:      General: She is not in acute distress.    Appearance: She is not ill-appearing.  HENT:     Nose: Nose normal.     Mouth/Throat:     Mouth: Mucous membranes are moist.     Pharynx: Oropharynx is clear.  Eyes:     General: Vision grossly intact. Gaze aligned appropriately.        Left eye: Hordeolum present.No foreign body or discharge.     Extraocular Movements: Extraocular movements intact.     Left eye: Normal extraocular motion.     Conjunctiva/sclera:     Left eye: Left conjunctiva is not injected.     Pupils: Pupils are equal, round, and reactive to light.      Comments: Stye on the left lower lid. Tender and erythematous. Mild swelling around base. No swelling of upper lid. Vision intact, no pain with EOM.   Cardiovascular:     Rate and Rhythm: Normal rate and regular rhythm.     Heart sounds: Normal heart sounds.  Pulmonary:     Effort: Pulmonary effort is normal.     Breath sounds: Normal breath sounds.  Musculoskeletal:     Cervical back: Normal range of motion.  Skin:    General: Skin is warm and dry.  Neurological:     Mental  Status: She is alert and oriented to person, place, and time.      UC Treatments / Results  Labs (all labs ordered are listed, but only abnormal results are displayed) Labs Reviewed - No data to display  EKG   Radiology No results found.  Procedures Procedures (including critical care time)  Medications Ordered in UC Medications - No data to display  Initial Impression / Assessment and Plan / UC Course  I have reviewed the triage vital signs and the nursing notes.  Pertinent labs & imaging results that were available during my care of the patient were reviewed by me and considered in my medical decision making (see chart for details).  Left lower eyelid stye Erythromycin  ointment twice daily for 5-6 days Discussed importance of warm compresses Further information on stye attached in AVS Discussed reasons to return to clinic Patient agrees to plan, no questions A note for work is provided  Final Clinical Impressions(s) / UC Diagnoses   Final diagnoses:  Hordeolum internum of right lower eyelid     Discharge Instructions      Erythromycin  ointment twice daily for 5-6 days  Warm compress for 10-15 minutes, 3-4 times daily  Wash hands frequently and try to avoid touching the eye otherwise     ED Prescriptions     Medication Sig Dispense Auth. Provider   erythromycin  ophthalmic ointment Place a 1/2 inch ribbon of ointment into the lower eyelid twice daily for 5-6 days 3.5 g Laurella Tull, Ivette Marks, PA-C      PDMP not reviewed this encounter.   Arvine Clayburn, Ivette Marks, [place] 04/24/23 1409

## 2023-04-24 NOTE — Discharge Instructions (Addendum)
 Erythromycin  ointment twice daily for 5-6 days  Warm compress for 10-15 minutes, 3-4 times daily  Wash hands frequently and try to avoid touching the eye otherwise

## 2023-05-10 ENCOUNTER — Ambulatory Visit: Admitting: Skilled Nursing Facility1

## 2023-05-24 ENCOUNTER — Encounter: Payer: Self-pay | Admitting: Registered"

## 2023-05-24 ENCOUNTER — Encounter: Attending: *Deleted | Admitting: Registered"

## 2023-05-24 DIAGNOSIS — R632 Polyphagia: Secondary | ICD-10-CM | POA: Insufficient documentation

## 2023-05-24 NOTE — Patient Instructions (Addendum)
-   Resume taking Bariatric Multivitamins and calcium supplements to during the day.  Bariatric Advantage [facility]  - Aim to stop eating when feeling satisfied.   - Aim to have 1 bottle of water a day.

## 2023-05-24 NOTE — Progress Notes (Signed)
 Appointment start time: 4:10  Appointment end time: 5:05  Patient was seen on 05/24/2023 for nutrition counseling pertaining to disordered eating  Primary care provider: Rhett Cella, NP Therapist:   ROI:  Any other medical team members: none reported   Assessment  Pt arrives stating she has started 1st dose of Wegovy and Vyvanse recently. States she is also taking MVI, Fe, Mg, K supplements. 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 States she is not taking bariatric multivitamins and thought you only needed them temporarily post-surgery.   Pt states she wants to start a program to stick to and lose the weight. States it is challenging to lose weight with surgery. Reports she feels hungry often. States she is snacking all the time. States she starts gagging when eating healthy things: veggies and cooked food. 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 States her stomach tolerates these items well. Reports recently completing her educational program.   States she has a disc challenge in her back and when she gains wieght she has back challenges. States she is having knee challenges now since gaining weight which interrupts her ability to kneel and pray comfortably. Reports prior to surgery she weighed 235 lbs, post surgery 174 lbs, and currently weighs 200 lbs.   States she is currently not participating in pilates and has not been doing anything recently due to school stress. States she loves pilates and will not start soon because of conflict in timing. States she will be going to [place] next month for about 6 weeks. States she will be walking a lot then. States she wants to lose weight before going to [place].  States bread is her weakness. States she doesn't like water. Reports drinking water at night only but it keeps her up at night because she is often going to the restroom. States she mainly drinks coffee  throughout the day. States she always wants something sweet with coffee in the morning. States she is hungry an hour after eating a meal and feels like she should stay full for at least 4 hours after eating.    Eating history: Length of time:  Previous treatments:  Goals for RD meetings:   Weight history:  Highest weight: 235   Lowest weight: 174 Most consistent weight: 200  What would you like to weigh: How has weight changed in the past year: weight gain  Medical Information:  Changes in hair, skin, nails since ED started:  Chewing/swallowing difficulties:  Reflux or heartburn:  Trouble with teeth:  LMP without the use of hormones:   Weight at that point:  Effect of exercise on menses:    Effect of hormones on menses:  Constipation, diarrhea:  Dizziness/lightheadedness:  Headaches/body aches:  Heart racing/chest pain:  Mood:  Sleep:  Focus/concentration:  Cold intolerance:  Vision changes:   Mental health diagnosis: BED   Dietary assessment: A typical day consists of 3 meals and 0 snacks  Safe foods include:  Avoided foods include:  24-Hr Dietary Recall First Meal: coffee + pastry  Snack:  Second Meal: [employer]-80% fried chicken sandwich  Snack:  Third Meal: 1.5 fried steak cheese egg rolls Snack:  Beverages: coffee (with milk)  What Methods Do You Use To Control Your Weight (Compensatory behaviors)?           Restricting (calories, fat, carbs)  SIV  Diet pills  Laxatives  Diuretics  Alcohol or drugs  Exercise (  what type)  Food rules or rituals (explain)  Binge   Nutrition Diagnosis: NB-1.5 Disordered eating pattern As related to obsessive desire to lose weight.  As evidenced by binge eating.  Intervention/Goals: Pt was educated and counseled on eating to nourish the body, ways to increase hydration, and importance of following post-bariatric surgery recommendations. Discussed listening to body and honoring fullness cues. Pt agreed with goals  listed. Goals:  - Resume taking Bariatric Multivitamins and calcium supplements to during the day.  Bariatric [facility] - Aim to stop eating when feeling satisfied.  - Aim to have 1 bottle of water a day.    Meal plan:    3 meals    1-3 snacks  Monitoring and Evaluation: Patient will follow up in 3 weeks.

## 2023-06-15 ENCOUNTER — Ambulatory Visit: Admitting: Registered"

## 2023-08-09 ENCOUNTER — Ambulatory Visit: Admitting: Registered"

## 2023-12-26 ENCOUNTER — Ambulatory Visit

## 2023-12-26 VITALS — Ht 68.0 in | Wt 203.0 lb

## 2023-12-26 DIAGNOSIS — B351 Tinea unguium: Secondary | ICD-10-CM | POA: Diagnosis not present

## 2023-12-26 DIAGNOSIS — L600 Ingrowing nail: Secondary | ICD-10-CM | POA: Diagnosis not present

## 2023-12-26 MED ORDER — HYDROCODONE-ACETAMINOPHEN 5-325 MG PO TABS: 1.0000 | ORAL_TABLET | Freq: Four times a day (QID) | ORAL | 0 refills | Status: DC | PRN

## 2023-12-26 MED ORDER — HYDROCODONE-ACETAMINOPHEN 5-325 MG PO TABS: 1.0000 | ORAL_TABLET | Freq: Four times a day (QID) | ORAL | 0 refills | Status: AC | PRN

## 2023-12-26 MED ORDER — TERBINAFINE HCL 250 MG PO TABS: 250.0000 mg | ORAL_TABLET | Freq: Every day | ORAL | 0 refills | Status: AC

## 2023-12-26 NOTE — Progress Notes (Addendum)
" ° °  Chief Complaint  Patient presents with   Ingrown Toenail    Pt is here due to bilateral great toenail ingrown, and possible fungus to toenails.    Subjective: Patient presents today for evaluation of pain to the medial border bilateral great toes. Patient is concerned for possible ingrown nail.  It is very sensitive to touch.  Patient presents today for further treatment and evaluation.  Past Medical History:  Diagnosis Date   Thyroid  disease     Past Surgical History:  Procedure Laterality Date   CESAREAN SECTION     LAPAROSCOPIC GASTRIC SLEEVE RESECTION     REDUCTION MAMMAPLASTY Bilateral 07/2015   THYROIDECTOMY     TONSILLECTOMY      Allergies[1]  Objective:  General: Well developed, nourished, in no acute distress, alert and oriented x3   Dermatology: Skin is warm, dry and supple bilateral.  Medial border of bilateral great toes is tender with evidence of an ingrowing nail. Pain on palpation noted to the border of the nail fold.  Hyperkeratotic dystrophic nail also noted to the left hallux nail plate consistent with fungal nail infection  Vascular: DP and PT pulses palpable.  No clinical evidence of vascular compromise  Neruologic: Grossly intact via light touch bilateral.  Musculoskeletal: No pedal deformity noted  Assesement: #1 Paronychia with ingrowing nail medial border bilateral great toes #2 fungal nail infection left great toenail  Plan of Care:  -Patient evaluated.  -Discussed treatment alternatives and plan of care. Explained nail avulsion procedure and post procedure course to patient. -Patient opted for permanent partial nail avulsion of the ingrown portion of the nail.  -Prior to procedure, local anesthesia infiltration utilized using 3 ml of a 50:50 mixture of 2% plain lidocaine and 0.5% plain marcaine in a normal hallux block fashion and a betadine prep performed.  -Partial permanent nail avulsion with chemical matrixectomy performed using 3x30sec  applications of phenol followed by alcohol flush.  -Light dressing applied.  Post care instructions provided -Today also we discussed the pathology and etiology of fungal nail infection.  Discussed different treatment options including oral, topical, and laser antifungal treatment modalities including relative efficacy's as well as risks and benefits associated with each modality.  She opts for oral antifungal medication.  Denies a history of liver pathology or symptoms -Prescription for Lamisil  2 and 50 mg #90 daily -Patient requesting pain medication due to anticipation of pain to the ingrown toenails.  Prescription for Vicodin 5/325 mg #12 PRN pain -Return to clinic 3 weeks  *5th grade teacher at [employer]  Thresa EMERSON Sar, DPM [facility]  Dr. Thresa EMERSON Sar, DPM    [address] [place], [place] [address]                Office [phone]  Fax [phone]        [1] No Known Allergies  "

## 2023-12-26 NOTE — Addendum Note (Signed)
 Addended by: JANIT THRESA HERO on: 12/26/2023 06:07 PM   Modules accepted: Orders

## 2023-12-26 NOTE — Patient Instructions (Signed)

## 2024-01-16 ENCOUNTER — Ambulatory Visit: Admitting: Podiatry

## 2024-01-16 VITALS — Ht 68.0 in | Wt 203.0 lb

## 2024-01-16 DIAGNOSIS — L600 Ingrowing nail: Secondary | ICD-10-CM

## 2024-01-16 MED ORDER — DOXYCYCLINE HYCLATE 100 MG PO TABS: 100.0000 mg | ORAL_TABLET | Freq: Two times a day (BID) | ORAL | 0 refills | Status: AC

## 2024-01-16 NOTE — Progress Notes (Signed)
" ° °  Chief Complaint  Patient presents with   Nail Problem    RM 6 Patient is here to f/u bilateral ingrown toenail removal of the halluces. Pt states continued pain and swelling in both hallux toe nails.    Subjective: 49 y.o. female presents today status post permanent nail avulsion procedure of the lateral border bilateral great toes that was performed on 12/26/2023.  Patient states that she is doing well.  She continues to have some slight tenderness to the area..   Past Medical History:  Diagnosis Date   Thyroid  disease     Objective: Neurovascular status intact.  Skin is warm, dry and supple. Nail and respective nail fold appears to be healing appropriately however there is some slight erythema with tenderness around the base of the nail plates bilateral.  Assessment: #1 s/p partial permanent nail matrixectomy lateral border bilateral great toes.  12/26/2023   Plan of care: #1 patient was evaluated  #2 light debridement of the periungual debris was performed to the border of the respective toe and nail plate using a tissue nipper. #3  Just to be safe I did go ahead and call in a prescription for doxycycline  100 mg twice daily x 10 days  #4 patient is to return to clinic 3 weeks.  If there continues to be tenderness to the area we may need to reanesthetized the toe and clean out any debris within the nail matricectomy site   Thresa EMERSON Sar, DPM [facility]  Dr. Thresa EMERSON Sar, DPM    [address] [place], [place] [address]                Office [phone]  Fax [phone]     "

## 2024-02-06 ENCOUNTER — Ambulatory Visit: Admitting: Podiatry

## 2024-02-21 ENCOUNTER — Encounter
